# Patient Record
Sex: Female | Born: 1975 | Race: Black or African American | Hispanic: No | Smoking: Never smoker
Health system: Southern US, Community
[De-identification: ages and names within clinical notes are randomized; demographics above are authoritative.]

## PROBLEM LIST (undated history)

## (undated) DIAGNOSIS — E119 Type 2 diabetes mellitus without complications: Secondary | ICD-10-CM

## (undated) DIAGNOSIS — T7840XA Allergy, unspecified, initial encounter: Secondary | ICD-10-CM

## (undated) DIAGNOSIS — N390 Urinary tract infection, site not specified: Secondary | ICD-10-CM

## (undated) DIAGNOSIS — IMO0002 Reserved for concepts with insufficient information to code with codable children: Secondary | ICD-10-CM

## (undated) DIAGNOSIS — D649 Anemia, unspecified: Secondary | ICD-10-CM

## (undated) DIAGNOSIS — R7303 Prediabetes: Secondary | ICD-10-CM

## (undated) HISTORY — PX: TUBAL LIGATION: SHX77

## (undated) HISTORY — DX: Prediabetes: R73.03

## (undated) HISTORY — DX: Reserved for concepts with insufficient information to code with codable children: IMO0002

## (undated) HISTORY — DX: Type 2 diabetes mellitus without complications: E11.9

## (undated) HISTORY — DX: Anemia, unspecified: D64.9

## (undated) HISTORY — DX: Allergy, unspecified, initial encounter: T78.40XA

---

## 2008-02-29 ENCOUNTER — Ambulatory Visit (HOSPITAL_COMMUNITY): Admission: RE | Admit: 2008-02-29 | Discharge: 2008-02-29 | Payer: Self-pay | Admitting: Occupational Medicine

## 2008-12-10 ENCOUNTER — Emergency Department (HOSPITAL_COMMUNITY): Admission: EM | Admit: 2008-12-10 | Discharge: 2008-12-10 | Payer: Self-pay | Admitting: Family Medicine

## 2009-06-02 ENCOUNTER — Emergency Department (HOSPITAL_COMMUNITY): Admission: EM | Admit: 2009-06-02 | Discharge: 2009-06-02 | Payer: Self-pay | Admitting: Family Medicine

## 2010-10-22 LAB — URINALYSIS, MICROSCOPIC ONLY
Glucose, UA: NEGATIVE mg/dL
Hgb urine dipstick: NEGATIVE
Protein, ur: NEGATIVE mg/dL
Specific Gravity, Urine: 1.034 — ABNORMAL HIGH (ref 1.005–1.030)
pH: 6 (ref 5.0–8.0)

## 2010-10-22 LAB — POCT URINALYSIS DIP (DEVICE)
Glucose, UA: NEGATIVE mg/dL
Ketones, ur: NEGATIVE mg/dL
Specific Gravity, Urine: >=1.03 (ref 1.005–1.030)
Urobilinogen, UA: 0.2 mg/dL (ref 0.0–1.0)

## 2010-10-22 LAB — URINE CULTURE

## 2011-10-10 ENCOUNTER — Emergency Department (HOSPITAL_COMMUNITY)
Admission: EM | Admit: 2011-10-10 | Discharge: 2011-10-10 | Disposition: A | Discharge status: Home / Self Care | Payer: 59 | Source: Home / Self Care | Attending: Emergency Medicine | Admitting: Emergency Medicine

## 2011-10-10 ENCOUNTER — Encounter (HOSPITAL_COMMUNITY): Payer: Self-pay

## 2011-10-10 DIAGNOSIS — T148XXA Other injury of unspecified body region, initial encounter: Secondary | ICD-10-CM

## 2011-10-10 HISTORY — DX: Urinary tract infection, site not specified: N39.0

## 2011-10-10 LAB — POCT URINALYSIS DIP (DEVICE)
Bilirubin Urine: NEGATIVE
Glucose, UA: NEGATIVE mg/dL
Hgb urine dipstick: NEGATIVE
pH: 7 (ref 5.0–8.0)

## 2011-10-10 MED ORDER — HYDROCODONE-ACETAMINOPHEN 5-325 MG PO TABS: 2.0000 | ORAL_TABLET | ORAL | Status: AC | PRN

## 2011-10-10 MED ORDER — METAXALONE 800 MG PO TABS: 800.0000 mg | ORAL_TABLET | Freq: Three times a day (TID) | ORAL | Status: AC

## 2011-10-10 MED ORDER — MELOXICAM 15 MG PO TABS: 15.0000 mg | ORAL_TABLET | Freq: Every day | ORAL | Status: AC

## 2011-10-10 NOTE — ED Notes (Signed)
Pt has rt sided mid-back pain since Tuesday, no known injury.

## 2011-10-10 NOTE — Discharge Instructions (Signed)
Take the medication as written. Take 1 gram of tylenol up to 4 times a day as needed for pain and fever. This with the meloxicam  is an effective combination for pain. Take the hydrocodone/norco  Do not take the tylenol and hydrocodone/norco  as they both have tylenol in them and too much can hurt your liver. Return if you get worse, have a  fever >100.4, or for any concerns.   Go to www.goodrx.com to look up your medications. This will give you a list of where you can find your prescriptions at the most affordable prices.

## 2011-10-10 NOTE — ED Provider Notes (Signed)
History     CSN: [id number]  Arrival date & time 10/10/11  0904   First MD Initiated Contact with Patient 10/10/11 [phone]      Chief Complaint  Patient presents with  . Back Pain    (Consider location/radiation/quality/duration/timing/severity/associated sxs/prior treatment) HPI Comments: Patient reports achy, nonradiating left mid thoracic back pain, that is worse in the morning and with sitting for prolonged periods of time. States she feels "stiff". Pain is better with stretching. She is also tried Motrin 100 mg without relief. Patient states that she is starting a new exercise regimen of karate 4-5 times a week, and that her symptoms started shortly after beginning this new exercise regimen. No coughing wheezing, chest pain, shortness of breath, urinary complaints.  ROS as noted in HPI. All other ROS negative.   Patient is a 36 y.o. female presenting with back pain. The history is provided by the patient. No language interpreter was used.  Back Pain  This is a new problem. The current episode started more than 2 days ago. The problem occurs constantly. The problem has not changed since onset.Associated with: Increase in physical activity. The pain is present in the thoracic spine. The quality of the pain is described as aching. The pain does not radiate. The pain is the same all the time. Stiffness is present in the morning. Pertinent negatives include no chest pain, no fever, no numbness, no abdominal pain, no dysuria, no pelvic pain, no leg pain and no weakness. She has tried NSAIDs for the symptoms. The treatment provided mild relief.    Past Medical History  Diagnosis Date  . Asthma   . UTI (lower urinary tract infection)     Past Surgical History  Procedure Date  . Cesarean section   . Tubal ligation     History reviewed. No pertinent family history.  History  Substance Use Topics  . Smoking status: Never Smoker   . Smokeless tobacco: Not on file  . Alcohol Use: No      OB History    Grav Para Term Preterm Abortions TAB SAB Ect Mult Living                  Review of Systems  Constitutional: Negative for fever.  Cardiovascular: Negative for chest pain.  Gastrointestinal: Negative for abdominal pain.  Genitourinary: Negative for dysuria and pelvic pain.  Musculoskeletal: Positive for back pain.  Neurological: Negative for weakness and numbness.    Allergies  Aspirin; Lactose intolerance (gi); Ultram; and Vioxx  Home Medications   Current Outpatient Rx  Name Route Sig Dispense Refill  . ALBUTEROL SULFATE HFA 108 (90 BASE) MCG/ACT IN AERS Inhalation Inhale 2 puffs into the lungs every 6 (six) hours as needed.    Marland Kitchen HYDROCODONE-ACETAMINOPHEN 5-325 MG PO TABS Oral Take 2 tablets by mouth every 4 (four) hours as needed for pain. 20 tablet 0  . MELOXICAM 15 MG PO TABS Oral Take 1 tablet (15 mg total) by mouth daily. 14 tablet 0  . METAXALONE 800 MG PO TABS Oral Take 1 tablet (800 mg total) by mouth 3 (three) times daily. 21 tablet 0    BP 112/73  Pulse 81  Temp(Src) 98.4 F (36.9 C) (Oral)  Resp 19  SpO2 99%  LMP 09/16/2011  Physical Exam  Nursing note and vitals reviewed. Constitutional: She is oriented to person, place, and time. She appears well-developed and well-nourished. No distress.  HENT:  Head: Normocephalic and atraumatic.  Eyes: Conjunctivae and EOM  are normal.  Neck: Normal range of motion.  Cardiovascular: Normal rate, regular rhythm and normal heart sounds.   Pulmonary/Chest: Effort normal and breath sounds normal.  Abdominal: Bowel sounds are normal. She exhibits no distension. There is no tenderness. There is no rebound and no guarding.       No CVA tenderness  Musculoskeletal: Normal range of motion.       Thoracic back: She exhibits tenderness and spasm. She exhibits no bony tenderness and no swelling.       Back:  Neurological: She is alert and oriented to person, place, and time.  Skin: Skin is warm and dry.   Psychiatric: She has a normal mood and affect. Her behavior is normal. Judgment and thought content normal.    ED Course  Procedures (including critical care time)  Labs Reviewed  POCT URINALYSIS DIP (DEVICE) - Abnormal; Notable for the following:    Ketones, ur TRACE (*)    Leukocytes, UA TRACE (*) Biochemical Testing Only. Please order routine urinalysis from main lab if confirmatory testing is needed.   All other components within normal limits  POCT PREGNANCY, URINE   No results found.   1. Muscle strain       MDM  Previous records reviewed. History of Escherichia coli UTI .[employer] narcotic database queried. No narcotic prescriptions in the past year. H&P most consistent with overuse/strain. Udip noted, and the discussed with patient. Patient has no urinary symptoms. Will not treat asymptomatic bacteriuria with patient upright. Will send home with NSAIDs, muscle relaxants, short prescription of Norco. Advised patient to try massage and decrease her physical activity level until she starts to feel better.  Luiz Blare, MD 10/10/11 [phone]

## 2014-04-04 ENCOUNTER — Encounter: Payer: Self-pay | Admitting: Dietician

## 2014-04-04 ENCOUNTER — Encounter: Payer: 59 | Attending: Family Medicine | Admitting: Dietician

## 2014-04-04 VITALS — Ht 64.0 in | Wt 140.0 lb

## 2014-04-04 DIAGNOSIS — Z713 Dietary counseling and surveillance: Secondary | ICD-10-CM | POA: Insufficient documentation

## 2014-04-04 DIAGNOSIS — R7309 Other abnormal glucose: Secondary | ICD-10-CM | POA: Diagnosis present

## 2014-04-04 DIAGNOSIS — Z833 Family history of diabetes mellitus: Secondary | ICD-10-CM | POA: Diagnosis not present

## 2014-04-04 DIAGNOSIS — R7303 Prediabetes: Secondary | ICD-10-CM

## 2014-04-04 NOTE — Progress Notes (Signed)
  Medical Nutrition Therapy:  Appt start time: 0900 end time:  1000.   Assessment:  Primary concerns today: Trysta is here today referred for prediabetes with an HgbA1c of 5.8%. She has a family history of type 2 diabetes on her mother's side. Hyperinsulinemia per patient. 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 Has 7 kids, aged 38-19 years. Also going to school full time for a bachelors in nursing.   Preferred Learning Style:   No preference indicated   Learning Readiness:   Ready   MEDICATIONS: inhaler and motrin prn   DIETARY INTAKE:  Avoided foods include dairy and pork.    24-hr recall:  B ( AM): sometimes skips  Snk ( AM): none  L ( PM): [employer] chicken nuggets or burger OR cafeteria fish or pot roast with vegetables Snk ( PM): none D ( PM): hot dogs, steak, baked chicken with french fries, broccoli, green beans, Brussels sprouts Snk ( PM): cakes or candy  Beverages: water and grape Powerade  Usual physical activity: none currently  Estimated energy needs: 1600-1800 calories 180-200 g carbohydrates  Progress Towards Goal(s):  In progress.   Nutritional Diagnosis:  [place]-2.2 Altered nutrition-related laboratory As related to strong family history of type 2 diabetes, excessive carbohydrate intake, and physical inactivity.  As evidenced by HgbA1c 5.8%.    Intervention:  Nutrition counseling provided. Goals: -Start eating breakfast  -Try Premier protein shakes  -Protein bars -Start cooking more  -Choose frozen vegetables when they are not in season -Have something to eat every 3-5 hours that you're awake  -Keep snacks on you at all times -Try Powerade Zero -Start walking and workout routine again  -Go back to martial arts  -3-5x a week  -Fill up on non-starchy vegetables  -Veggies are good raw or cooked, fresh or frozen -Watch portions of carbs  -Choose high fiber and low sugar carbs  -Have 2-3 carb servings per meal and 1 at snacks -Snack  on fruit and cashews at night instead of cake and cookies  -Watch portions!  -Practice reading serving size on food labels  -Try having a mini bag of popcorn with a fun size pack of M&Ms  -Try having individually wrapped candies if you're craving something sweet and stick to 3 -Pre portion snacks in baggies or bowls or tupperwares  Teaching Method Utilized:  Visual Auditory Hands on  Handouts given during visit include:  Carbohydrate serving card  MyPlate  15g CHO + protein snacks  Samples provided and patient instructed on proper use: Premier protein shake (strawberry - qty 2) Lot#: [id number] Exp: 11/2014  Barriers to learning/adherence to lifestyle change: schedule  Demonstrated degree of understanding via:  Teach Back   Monitoring/Evaluation:  Dietary intake, exercise, and body weight in 4 week(s).

## 2014-04-04 NOTE — Patient Instructions (Addendum)
-  Start eating breakfast  -Try Premier protein shakes  -Protein bars  -Start cooking more  -Choose frozen vegetables when they are not in season  -Have something to eat every 3-5 hours that you're awake  -Keep snacks on you at all times  -Try Powerade Zero  -Start walking and workout routine again  -Go back to martial arts  -3-5x a week   -Fill up on non-starchy vegetables  -Veggies are good raw or cooked, fresh or frozen  -Watch portions of carbs  -Choose high fiber and low sugar carbs  -Have 2-3 carb servings per meal and 1 at snacks  -Snack on fruit and cashews at night instead of cake and cookies  -Watch portions!  -Practice reading serving size on food labels  -Try having a mini bag of popcorn with a fun size pack of M&Ms  -Try having individually wrapped candies if you're craving something sweet and stick to 3  -Pre portion snacks in baggies or bowls or tupperwares 

## 2014-05-16 ENCOUNTER — Encounter: Payer: 59 | Attending: Family Medicine | Admitting: Dietician

## 2014-05-16 VITALS — Wt 143.7 lb

## 2014-05-16 DIAGNOSIS — R7303 Prediabetes: Secondary | ICD-10-CM

## 2014-05-16 DIAGNOSIS — R7309 Other abnormal glucose: Secondary | ICD-10-CM | POA: Diagnosis present

## 2014-05-16 DIAGNOSIS — Z713 Dietary counseling and surveillance: Secondary | ICD-10-CM | POA: Insufficient documentation

## 2014-05-16 NOTE — Patient Instructions (Signed)
-  Start eating breakfast  -Try Premier protein shakes  -Protein bars  -Start cooking more  -Choose frozen vegetables when they are not in season  -Have something to eat every 3-5 hours that you're awake  -Keep snacks on you at all times  -Try Powerade Zero  -Start walking and workout routine again  -Go back to martial arts  -3-5x a week   -Fill up on non-starchy vegetables  -Veggies are good raw or cooked, fresh or frozen  -Watch portions of carbs  -Choose high fiber and low sugar carbs  -Have 2-3 carb servings per meal and 1 at snacks  -Snack on fruit and cashews at night instead of cake and cookies  -Watch portions!  -Practice reading serving size on food labels  -Try having a mini bag of popcorn with a fun size pack of M&Ms  -Try having individually wrapped candies if you're craving something sweet and stick to 3  -Pre portion snacks in baggies or bowls or tupperwares

## 2014-05-16 NOTE — Progress Notes (Signed)
  Medical Nutrition Therapy:  Appt start time: 1200 end time:  1215   Follow up:  Deborah Chavez returns today having gained 3 pounds. However, she has made many positive lifestyle changes. She states that she has been focusing on serving sizes of foods, reading food labels, and pairing carbohydrates with protein foods. No longer eating cakes at night. Eating breakfast every day now with a carb and protein. She is pre-portioning her snacks into baggies. Started drinking Powerade zero instead of regular powerade. Had HgbA1c lab drawn today, no results yet. 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 Deborah Chavez is currently waiting for strips and lancets to be approved. Recent abnormal pap smear, being tested for cancer.  Preferred Learning Style:   No preference indicated   Learning Readiness:   Ready   MEDICATIONS: inhaler and motrin prn   DIETARY INTAKE:  Avoided foods include dairy and pork.    24-hr recall:  B ( AM): pancake or muffin with sausage  Snk ( AM): none  L ( PM): [place] sub or grilled chicken or chicken and broccoli Snk ( PM): pretzels or oranges D ( PM): hot dogs, steak, baked chicken with french fries, broccoli, green beans, Brussels sprouts Snk ( PM):   Beverages: Powerade zero and water  Usual physical activity: karate 2-4x a week  Estimated energy needs: 1600-1800 calories 180-200 g carbohydrates  Progress Towards Goal(s):  In progress.   Nutritional Diagnosis:  [place]-2.2 Altered nutrition-related laboratory As related to strong family history of type 2 diabetes, excessive carbohydrate intake, and physical inactivity.  As evidenced by HgbA1c 5.8%.    Intervention:  Nutrition counseling provided.   Teaching Method Utilized:  Visual Auditory Hands on  Barriers to learning/adherence to lifestyle change: schedule  Demonstrated degree of understanding via:  Teach Back   Monitoring/Evaluation:  Dietary intake, exercise, and body weight in 4 week(s).

## 2014-09-22 ENCOUNTER — Emergency Department (HOSPITAL_COMMUNITY)
Admission: EM | Admit: 2014-09-22 | Discharge: 2014-09-22 | Disposition: A | Discharge status: Home / Self Care | Payer: 59 | Attending: Emergency Medicine | Admitting: Emergency Medicine

## 2014-09-22 ENCOUNTER — Encounter (HOSPITAL_COMMUNITY): Payer: Self-pay | Admitting: Emergency Medicine

## 2014-09-22 DIAGNOSIS — Z3202 Encounter for pregnancy test, result negative: Secondary | ICD-10-CM | POA: Diagnosis not present

## 2014-09-22 DIAGNOSIS — J45909 Unspecified asthma, uncomplicated: Secondary | ICD-10-CM | POA: Diagnosis not present

## 2014-09-22 DIAGNOSIS — E162 Hypoglycemia, unspecified: Secondary | ICD-10-CM | POA: Diagnosis present

## 2014-09-22 DIAGNOSIS — Z87440 Personal history of urinary (tract) infections: Secondary | ICD-10-CM | POA: Diagnosis not present

## 2014-09-22 DIAGNOSIS — Z79899 Other long term (current) drug therapy: Secondary | ICD-10-CM | POA: Insufficient documentation

## 2014-09-22 DIAGNOSIS — R42 Dizziness and giddiness: Secondary | ICD-10-CM | POA: Diagnosis not present

## 2014-09-22 LAB — URINALYSIS, ROUTINE W REFLEX MICROSCOPIC
BILIRUBIN URINE: NEGATIVE
GLUCOSE, UA: NEGATIVE mg/dL
HGB URINE DIPSTICK: NEGATIVE
Ketones, ur: NEGATIVE mg/dL
LEUKOCYTES UA: NEGATIVE
Nitrite: NEGATIVE
PROTEIN: NEGATIVE mg/dL
SPECIFIC GRAVITY, URINE: 1.029 (ref 1.005–1.030)
Urobilinogen, UA: 1 mg/dL (ref 0.0–1.0)
pH: 7 (ref 5.0–8.0)

## 2014-09-22 LAB — I-STAT CHEM 8, ED
BUN: 17 mg/dL (ref 6–23)
CALCIUM ION: 1.08 mmol/L — AB (ref 1.12–1.23)
CHLORIDE: 100 mmol/L (ref 96–112)
CREATININE: 0.8 mg/dL (ref 0.50–1.10)
Glucose, Bld: 110 mg/dL — ABNORMAL HIGH (ref 70–99)
HEMATOCRIT: 39 % (ref 36.0–46.0)
Hemoglobin: 13.3 g/dL (ref 12.0–15.0)
Potassium: 3.8 mmol/L (ref 3.5–5.1)
Sodium: 137 mmol/L (ref 135–145)
TCO2: 23 mmol/L (ref 0–100)

## 2014-09-22 LAB — CBG MONITORING, ED: Glucose-Capillary: 111 mg/dL — ABNORMAL HIGH (ref 70–99)

## 2014-09-22 LAB — PREGNANCY, URINE: Preg Test, Ur: NEGATIVE

## 2014-09-22 NOTE — ED Provider Notes (Signed)
CSN: [id number]     Arrival date & time 09/22/14  1714 History   First MD Initiated Contact with Patient 09/22/14 1720     Chief Complaint  Patient presents with  . Hypoglycemia     (Consider location/radiation/quality/duration/timing/severity/associated sxs/prior Treatment) HPI Deborah Chavez is a 39 y.o. female with a history of asthma and prediabetes comes in for evaluation of "low blood sugar". Patient states she does not take diabetes medications yet, but has been monitoring her sugars very closely recently and any time she falls below 90 she begins to feel "flushed, dizzy and sweaty". She reports having felt the symptoms throughout the day today and noted her blood sugars to be 65 at lunch time and 85 after lunch. She is concerned because she checked it once more after lunch and the reading was 81. She does, however report feeling better after lunch and then began to feel more dizzy this afternoon. She characterizes the dizziness as "the room is spinning around me". She denies any headaches, vision changes, chest pain, short of breath, nausea or vomiting, abdominal pain, weakness, syncope, rash. She reports her menstrual cycle is unchanged, denies any dark stools or bloody diarrhea. CBG on arrival 111  Past Medical History  Diagnosis Date  . Asthma   . UTI (lower urinary tract infection)   . Prediabetes    Past Surgical History  Procedure Laterality Date  . Cesarean section    . Tubal ligation     Family History  Problem Relation Age of Onset  . Diabetes Mother   . Diabetes Maternal Aunt   . Diabetes Maternal Uncle   . Diabetes Maternal Grandmother   . Cancer Other   . Stroke Other   . Hypertension Other    History  Substance Use Topics  . Smoking status: Never Smoker   . Smokeless tobacco: Not on file  . Alcohol Use: No   OB History    No data available     Review of Systems A 10 point review of systems was completed and was negative except for pertinent positives  and negatives as mentioned in the history of present illness     Allergies  Aspirin; Lactose intolerance (gi); Ultram; and Vioxx  Home Medications   Prior to Admission medications   Medication Sig Start Date End Date Taking? Authorizing Provider  albuterol (PROVENTIL HFA;VENTOLIN HFA) 108 (90 BASE) MCG/ACT inhaler Inhale 2 puffs into the lungs every 6 (six) hours as needed.    Historical Provider, MD   BP 103/61 mmHg  Pulse 85  Temp(Src) 98.5 F (36.9 C) (Oral)  Resp 11  Ht 5\' 4"  (1.626 m)  SpO2 99%  LMP 09/07/2014 Physical Exam  Constitutional: She is oriented to person, place, and time. She appears well-developed and well-nourished. No distress.  HENT:  Head: Normocephalic and atraumatic.  Mouth/Throat: Oropharynx is clear and moist. No oropharyngeal exudate.  Eyes: Conjunctivae are normal. Pupils are equal, round, and reactive to light. Right eye exhibits no discharge. Left eye exhibits no discharge. No scleral icterus.  Neck: Neck supple.  Cardiovascular: Normal rate, regular rhythm, normal heart sounds and intact distal pulses.   Pulmonary/Chest: Effort normal and breath sounds normal. No respiratory distress. She has no wheezes. She has no rales.  Abdominal: Soft. There is no tenderness.  Musculoskeletal: She exhibits no tenderness.  Neurological: She is alert and oriented to person, place, and time.  Cranial Nerves II-XII grossly intact. Motor and sensation 5/5 in all 4 extremities. Gait is  baseline  Skin: Skin is warm and dry. No rash noted. She is not diaphoretic.  Psychiatric: She has a normal mood and affect.  Nursing note and vitals reviewed.   ED Course  Procedures (including critical care time) Labs Review Labs Reviewed  CBG MONITORING, ED - Abnormal; Notable for the following:    Glucose-Capillary 111 (*)    All other components within normal limits  I-STAT CHEM 8, ED - Abnormal; Notable for the following:    Glucose, Bld 110 (*)    Calcium, Ion 1.08  (*)    All other components within normal limits  PREGNANCY, URINE  URINALYSIS, ROUTINE W REFLEX MICROSCOPIC    Imaging Review No results found.   EKG Interpretation   Date/Time:  Saturday September 22 2014 18:00:24 EST Ventricular Rate:  84 PR Interval:  157 QRS Duration: 78 QT Interval:  363 QTC Calculation: 429 R Axis:   68 Text Interpretation:  Sinus rhythm Isolated TWI lead III Confirmed by  DOCHERTY  MD, MEGAN ([id number]) on 09/22/2014 6:34:13 PM     Meds given in ED:  Medications - No data to display  New Prescriptions   No medications on file   Filed Vitals:   09/22/14 1900 09/22/14 1915 09/22/14 1945 09/22/14 1946  BP: 113/68 105/68 103/61   Pulse: 81 78 85   Temp:    98.5 F (36.9 C)  TempSrc:    Oral  Resp: Height:      SpO2: 100% 100% 99%     MDM  Vitals stable - WNL -afebrile Pt resting comfortably in ED. Denies dizziness or discomfort now. PE--normal neuro exam. Otherwise Unremarkable Labwork noncontributory. CBG 110. EKG not concerning.  DDX--no evidence of hypoglycemia. No emergent cause for dizziness or diaphoresis at this time. The symptoms have resolved in the ED spontaneously. Discussed follow-up with her PCP for further evaluation and management of her symptoms   I discussed all relevant lab findings and imaging results with pt and they verbalized understanding. Discussed f/u with PCP within 48 hrs and return precautions, pt very amenable to plan. Patient appears well, is in good condition and is appropriate for discharge.  Prior to patient discharge, I discussed and reviewed this case with Dr. Micheline Maze   Final diagnoses:  Dizziness        Sharlene Motts, PA-C 09/22/14 1953  Toy Cookey, MD 09/23/14 [phone]

## 2014-09-22 NOTE — Discharge Instructions (Signed)

## 2014-09-22 NOTE — ED Notes (Signed)
CBG 111 

## 2014-09-22 NOTE — ED Notes (Signed)
Prediabetic, no diabetic medications prescribed, checking CBG due to dizziness.  CBG 65 at 1200, took 2 glucose tablets, grahams with peanut butter, and [place] sausage. Rechecked at 1320 and CBG 85; ate again; at 1517 CBG 81. Continued to feel dizzy and diaphoretic, checked again at 1639, CBG 80.

## 2014-09-22 NOTE — ED Notes (Signed)
CBG was 111, Nurse was informed.

## 2015-07-24 DIAGNOSIS — Z0100 Encounter for examination of eyes and vision without abnormal findings: Secondary | ICD-10-CM | POA: Diagnosis not present

## 2015-09-03 DIAGNOSIS — Z23 Encounter for immunization: Secondary | ICD-10-CM | POA: Diagnosis not present

## 2015-09-03 DIAGNOSIS — Z7189 Other specified counseling: Secondary | ICD-10-CM | POA: Diagnosis not present

## 2016-01-06 ENCOUNTER — Ambulatory Visit (INDEPENDENT_AMBULATORY_CARE_PROVIDER_SITE_OTHER): Payer: 59 | Admitting: Family Medicine

## 2016-01-06 VITALS — BP 118/74 | HR 66 | Temp 98.1°F | Resp 18 | Ht 64.0 in | Wt 150.8 lb

## 2016-01-06 DIAGNOSIS — M25562 Pain in left knee: Secondary | ICD-10-CM | POA: Diagnosis not present

## 2016-01-06 DIAGNOSIS — S83207A Unspecified tear of unspecified meniscus, current injury, left knee, initial encounter: Secondary | ICD-10-CM

## 2016-01-06 MED ORDER — MELOXICAM 15 MG PO TABS: 15.0000 mg | ORAL_TABLET | Freq: Every day | ORAL | Status: DC

## 2016-01-06 MED FILL — MELOXICAM 15 MG TABLET: 15 | 30 days supply | Qty: 30 | Fill #0

## 2016-01-06 NOTE — Patient Instructions (Addendum)
   IF you received an x-ray today, you will receive an invoice from [facility]. Please contact [facility] at [phone] with questions or concerns regarding your invoice.   IF you received labwork today, you will receive an invoice from [facility]. Please contact [facility] at [phone] with questions or concerns regarding your invoice.   Our billing staff will not be able to assist you with questions regarding bills from these companies.  You will be contacted with the lab results as soon as they are available. The fastest way to get your results is to activate your My Chart account. Instructions are located on the last page of this paperwork. If you have not heard from us regarding the results in 2 weeks, please contact this office.     Meniscus Tear With Phase I Rehab The meniscus is a C-shaped cartilage structure, located in the knee joint between the thigh bone (femur) and the shinbone (tibia). Two menisci are located in each knee joint: the inner and outer meniscus. The meniscus acts as an adapter between the thigh bone and shinbone, allowing them to fit properly together. It also functions as a shock absorber, to reduce the stress placed on the knee joint and to help supply nutrients to the knee joint cartilage. As people age, the meniscus begins to harden and become more vulnerable to injury. Meniscus tears are a common injury, especially in older athletes. Inner meniscus tears are more common than outer meniscus tears.  SYMPTOMS   Pain in the knee, especially with standing or squatting with the affected leg.  Tenderness along the joint line.  Swelling in the knee joint (effusion), usually starting 1 to 2 days after injury.  Locking or catching of the knee joint, causing inability to straighten the knee completely.  Giving way or buckling of the knee. CAUSES  A meniscus tear occurs when a force is placed on the meniscus that is  greater than it can handle. Common causes of injury include:  Direct hit (trauma) to the knee.  Twisting, pivoting, or cutting (rapidly changing direction while running), kneeling or squatting.  Without injury, due to aging. RISK INCREASES WITH:  Contact sports (football, rugby).  Sports in which cleats are used with pivoting (soccer, lacrosse) or sports in which good shoe grip and sudden change in direction are required (racquetball, basketball, squash).  Previous knee injury.  Associated knee injury, particularly ligament injuries.  Poor strength and flexibility. PREVENTION  Warm up and stretch properly before activity.  Maintain physical fitness:  Strength, flexibility, and endurance.  Cardiovascular fitness.  Protect the knee with a brace or elastic bandage.  Wear properly fitted protective equipment (proper cleats for the surface). PROGNOSIS  Sometimes, meniscus tears heal on their own. However, definitive treatment requires surgery, followed by at least 6 weeks of recovery.  RELATED COMPLICATIONS   Recurring symptoms that result in a chronic problem.  Repeated knee injury, especially if sports are resumed too soon after injury or surgery.  Progression of the tear (the tear gets larger), if untreated.  Arthritis of the knee in later years (with or without surgery).  Complications of surgery, including infection, bleeding, injury to nerves (numbness, weakness, paralysis) continued pain, giving way, locking, nonhealing of meniscus (if repaired), need for further surgery, and knee stiffness (loss of motion). TREATMENT  Treatment first involves the use of ice and medicine, to reduce pain and inflammation. You may find using crutches to walk more comfortable. However, it is okay to   bear weight on the injured knee, if the pain will allow it. Surgery is often advised as a definitive treatment. Surgery is performed through an incision near the joint (arthroscopically). The  torn piece of the meniscus is removed, and if possible the joint cartilage is repaired. After surgery, the joint must be restrained. After restraint, it is important to perform strengthening and stretching exercises to help regain strength and a full range of motion. These exercises may be completed at home or with a therapist.  MEDICATION  If pain medicine is needed, nonsteroidal anti-inflammatory medicines (aspirin and ibuprofen), or other minor pain relievers (acetaminophen), are often advised.  Do not take pain medicine for 7 days before surgery.  Prescription pain relievers may be given, if your caregiver thinks they are needed. Use only as directed and only as much as you need. HEAT AND COLD  Cold treatment (icing) should be applied for 10 to 15 minutes every 2 to 3 hours for inflammation and pain, and immediately after activity that aggravates your symptoms. Use ice packs or an ice massage.  Heat treatment may be used before performing stretching and strengthening activities prescribed by your caregiver, physical therapist, or athletic trainer. Use a heat pack or a warm water soak. SEEK MEDICAL CARE IF:   Symptoms get worse or do not improve in 2 weeks, despite treatment.  New, unexplained symptoms develop. (Drugs used in treatment may produce side effects.) EXERCISES RANGE OF MOTION (ROM) AND STRETCHING EXERCISES - Meniscus Tear, Non-operative, Phase I These are some of the initial exercises with which you may start your rehabilitation program, until you see your caregiver again or until your symptoms are resolved. Remember:   These initial exercises are intended to be gentle. They will help you restore motion without increasing any swelling.  Completing these exercises allows less painful movement and prepares you for the more aggressive strengthening exercises in Phase II.  An effective stretch should be held for at least 30 seconds.  A stretch should never be painful. You  should only feel a gentle lengthening or release in the stretched tissue. RANGE OF MOTION - Knee Flexion, Active  Lie on your back with both knees straight. (If this causes back discomfort, bend your healthy knee, placing your foot flat on the floor.)  Slowly slide your heel back toward your buttocks until you feel a gentle stretch in the front of your knee or thigh.  Hold for __________ seconds. Slowly slide your heel back to the starting position. Repeat __________ times. Complete this exercise __________ times per day.  RANGE OF MOTION - Knee Flexion and Extension, Active-Assisted  Sit on the edge of a table or chair with your thighs firmly supported. It may be helpful to place a folded towel under the end of your right / left thigh.  Flexion (bending): Place the ankle of your healthy leg on top of the other ankle. Use your healthy leg to gently bend your right / left knee until you feel a mild tension across the top of your knee.  Hold for __________ seconds.  Extension (straightening): Switch your ankles so your right / left leg is on top. Use your healthy leg to straighten your right / left knee until you feel a mild tension on the backside of your knee.  Hold for __________ seconds. Repeat __________ times. Complete __________ times per day. STRETCH - Knee Flexion, Supine  Lie on the floor with your right / left heel and foot lightly touching the wall. (  Place both feet on the wall if you do not use a door frame.)  Without using any effort, allow gravity to slide your foot down the wall slowly until you feel a gentle stretch in the front of your right / left knee.  Hold this stretch for __________ seconds. Then return the leg to the starting position, using your healthy leg for help, if needed. Repeat __________ times. Complete this stretch __________ times per day.  STRETCH - Knee Extension Sitting  Sit with your right / left leg/heel propped on another chair, coffee table, or  foot stool.  Allow your leg muscles to relax, letting gravity straighten out your knee.*  You should feel a stretch behind your right / left knee. Hold this position for __________ seconds. Repeat __________ times. Complete this stretch __________ times per day.  *Your physician, physical therapist or athletic trainer may instruct you place a __________ weight on your thigh, just above your kneecap, to deepen the stretch.  STRENGTHENING EXERCISES - Meniscus Tear, Non-operative, Phase I These exercises may help you when beginning to rehabilitate your injury. They may resolve your symptoms with or without further involvement from your physician, physical therapist or athletic trainer. While completing these exercises, remember:   Muscles can gain both the endurance and the strength needed for everyday activities through controlled exercises.  Complete these exercises as instructed by your physician, physical therapist or athletic trainer. Progress the resistance and repetitions only as guided. STRENGTH - Quadriceps, Isometrics  Lie on your back with your right / left leg extended and your opposite knee bent.  Gradually tense the muscles in the front of your right / left thigh. You should see either your knee cap slide up toward your hip or increased dimpling just above the knee. This motion will push the back of the knee down toward the floor, mat, or bed on which you are lying.  Hold the muscle as tight as you can, without increasing your pain, for __________ seconds.  Relax the muscles slowly and completely between each repetition. Repeat __________ times. Complete this exercise __________ times per day.  STRENGTH - Quadriceps, Short Arcs   Lie on your back. Place a __________ inch towel roll under your right / left knee, so that the knee bends slightly.  Raise only your lower leg by tightening the muscles in the front of your thigh. Do not allow your thigh to rise.  Hold this position  for __________ seconds. Repeat __________ times. Complete this exercise __________ times per day.  OPTIONAL ANKLE WEIGHTS: Begin with ____________________, but DO NOT exceed ____________________. Increase in 1 pound/0.5 kilogram increments. STRENGTH - Quadriceps, Straight Leg Raises  Quality counts! Watch for signs that the quadriceps muscle is working, to be sure you are strengthening the correct muscles and not "cheating" by substituting with healthier muscles.  Lay on your back with your right / left leg extended and your opposite knee bent.  Tense the muscles in the front of your right / left thigh. You should see either your knee cap slide up or increased dimpling just above the knee. Your thigh may even shake a bit.  Tighten these muscles even more and raise your leg 4 to 6 inches off the floor. Hold for __________ seconds.  Keeping these muscles tense, lower your leg.  Relax the muscles slowly and completely in between each repetition. Repeat __________ times. Complete this exercise __________ times per day.  STRENGTH - Hamstring, Curls   Lay on your stomach   with your legs extended. (If you lay on a bed, your feet may hang over the edge.)  Tighten the muscles in the back of your thigh to bend your right / left knee up to 90 degrees. Keep your hips flat on the bed.  Hold this position for __________ seconds.  Slowly lower your leg back to the starting position. Repeat __________ times. Complete this exercise __________ times per day.  STRENGTH - Quadriceps, Squats  Stand in a door frame so that your feet and knees are in line with the frame.  Use your hands for balance, not support, on the frame.  Slowly lower your weight, bending at the hips and knees. Keep your lower legs upright so that they are parallel with the door frame. Squat only within the range that does not increase your knee pain. Never let your hips drop below your knees.  Slowly return upright, pushing with your  legs, not pulling with your hands. Repeat __________ times. Complete this exercise __________ times per day.  STRENGTH - Quad/VMO, Isometric   Sit in a chair with your right / left knee slightly bent. With your fingertips, feel the VMO muscle just above the inside of your knee. The VMO is important in controlling the position of your kneecap.  Keeping your fingertips on this muscle. Without actually moving your leg, attempt to drive your knee down as if straightening your leg. You should feel your VMO tense. If you have a difficult time, you may wish to try the same exercise on your healthy knee first.  Tense this muscle as hard as you can without increasing any knee pain.  Hold for __________ seconds. Relax the muscles slowly and completely in between each repetition. Repeat __________ times. Complete exercise __________ times per day.    This information is not intended to replace advice given to you by your health care provider. Make sure you discuss any questions you have with your health care provider.   Document Released: 07/20/1998 Document Revised: 11/20/2014 Document Reviewed: 10/18/2008 Elsevier Interactive Patient Education [employer]

## 2016-01-06 NOTE — Progress Notes (Addendum)
By signing my name below, I, Mesha Guinyard, attest that this documentation has been prepared under the direction and in the presence of Norberto Sorenson, MD.  Electronically Signed: Arvilla Market, Medical Scribe. 01/06/2016. 12:28 PM.  Subjective:    Patient ID: Deborah Chavez, female    DOB: [birth year], 40 y.o.   MRN: [id number]  HPI  Chief Complaint  Patient presents with  . Knee Pain    Has had knee problems in the past. left knee. knee wants to lock up at times.     HPI Comments: EDLA PARA is a 40 y.o. female who presents to the [facility] complaining of left knee pain. Pt has had a past knee injury, and when she tries to get up it will lock up on her and twist when it does. Pt heard and felt a loud pop 6 days ago. When pt bends her knee, it will lock up and she has to force it to straighten out; this takes an hour to a day to straighten out. Pt has pain with ambulation; at times she can hardly put pressure on her toes when getting up. Pt has knee sleeve at home that give little relief to her symptoms. Pt initially injured her knee at by banging in the handles when moving inside her desk and a hitting a med cart with her knee.  Pt had a MRI done to her knee in the past- pt never had a specific ligament tear. Pt has [place] orthopedic [place] few years ago: X-Ray a few years ago didn't see anything wrong with her leg. [facility] doesn't offer the PT her insurance covers.  Pt brakes out into a itchy rash when she takes Vioxx and Motrin puts her to sleep.  There are no active problems to display for this patient.  Past Medical History  Diagnosis Date  . Asthma   . UTI (lower urinary tract infection)   . Prediabetes   . Allergy   . Anemia   . Diabetes mellitus without complication [facility])    Past Surgical History  Procedure Laterality Date  . Cesarean section    . Tubal ligation     Allergies  Allergen Reactions  . Aspirin   . Lactose Intolerance  (Gi)   . Ultram [Tramadol Hcl]   . Vioxx [Rofecoxib]    Prior to Admission medications   Medication Sig Start Date End Date Taking? Authorizing Provider  albuterol (PROVENTIL HFA;VENTOLIN HFA) 108 (90 BASE) MCG/ACT inhaler Inhale 2 puffs into the lungs every 6 (six) hours as needed.   Yes Historical Provider, MD  ibuprofen (ADVIL,MOTRIN) 200 MG tablet Take 200 mg by mouth every 6 (six) hours as needed.   Yes Historical Provider, MD  montelukast (SINGULAIR) 10 MG tablet Take 10 mg by mouth at bedtime.   Yes Historical Provider, MD   Social History   Social History  . Marital Status: Married    Spouse Name: N/A  . Number of Children: N/A  . Years of Education: N/A   Occupational History  . Not on file.   Social History Main Topics  . Smoking status: Never Smoker   . Smokeless tobacco: Not on file  . Alcohol Use: No  . Drug Use: No  . Sexual Activity: Not on file   Other Topics Concern  . Not on file   Social History Narrative    Depression screen [facility] 2/9 01/06/2016  Decreased Interest 0  Down, Depressed,  Hopeless 0  PHQ - 2 Score 0    Review of Systems  Constitutional: Positive for activity change. Negative for fever.  HENT: Negative for rhinorrhea and sneezing.   Respiratory: Negative for cough.   Cardiovascular: Negative for leg swelling.  Musculoskeletal: Positive for arthralgias and gait problem. Negative for myalgias and joint swelling.  Neurological: Positive for weakness. Negative for numbness and headaches.  Hematological: Does not bruise/bleed easily.  Psychiatric/Behavioral: Positive for sleep disturbance.     Objective:  BP 118/74 mmHg  Pulse 66  Temp(Src) 98.1 F (36.7 C) (Oral)  Resp 18  Ht 5\' 4"  (1.626 m)  Wt 150 lb 12.8 oz (68.402 kg)  BMI 25.87 kg/m2  SpO2 100%  LMP 12/02/2015  Physical Exam  Constitutional: She appears well-developed and well-nourished. No distress.  HENT:  Head: Normocephalic and atraumatic.  Eyes: Conjunctivae are  normal.  Neck: Neck supple.  Cardiovascular: Normal rate.   Pulmonary/Chest: Effort normal.  Musculoskeletal:  Positive mild effusion No joint line tenderness No tenderness on the tibial platue Non tenderness of ACL or LCL No laxity with varus or valgus stress No crepitus Negative McMurrays No anterior and posterior drawer No bakers cyst palpable  Neurological: She is alert.  Skin: Skin is warm and dry.  Psychiatric: She has a normal mood and affect. Her behavior is normal.  Nursing note and vitals reviewed.   12:47 PM pt feels more secure with the knee brace but can't move her knee more than 10 degrees. Pt was able to bend her knee once the knee brace was put on properly.    Assessment & Plan:   1. Knee pain, acute, left   2. Acute meniscal tear of knee, left, initial encounter - no known injury but certainly history of twisting and locking sensation consistent. Placed in hinged knee brace and refer to ortho ([facility] for further eval - may need repeat MRI.  Has tolerated mobic in past other than sedation so try qhs.    Orders Placed This Encounter  Procedures  . Ambulatory referral to Orthopedic Surgery    Referral Priority:  Routine    Referral Type:  Surgical    Referral Reason:  Specialty Services Required    Requested Specialty:  Orthopedic Surgery    Number of Visits Requested:  1    Meds ordered this encounter  Medications  . montelukast (SINGULAIR) 10 MG tablet    Sig: Take 10 mg by mouth at bedtime.  Marland Kitchen. DISCONTD: ibuprofen (ADVIL,MOTRIN) 200 MG tablet    Sig: Take 200 mg by mouth every 6 (six) hours as needed.  . meloxicam (MOBIC) 15 MG tablet    Sig: Take 1 tablet (15 mg total) by mouth at bedtime.    Dispense:  30 tablet    Refill:  1    I personally performed the services described in this documentation, which was scribed in my presence. The recorded information has been reviewed and considered, and addended by me as needed.    Norberto SorensonEva Antwoine Zorn, M.D.  [facility]  [place] [address] [place], [place] [address] [phone] phone [phone] fax  01/06/2016 1:15 PM

## 2016-01-22 DIAGNOSIS — R7309 Other abnormal glucose: Secondary | ICD-10-CM | POA: Diagnosis not present

## 2016-01-22 DIAGNOSIS — S8390XA Sprain of unspecified site of unspecified knee, initial encounter: Secondary | ICD-10-CM | POA: Diagnosis not present

## 2016-01-22 DIAGNOSIS — M25562 Pain in left knee: Secondary | ICD-10-CM | POA: Diagnosis not present

## 2016-01-24 ENCOUNTER — Other Ambulatory Visit (HOSPITAL_COMMUNITY): Payer: Self-pay | Admitting: Physician Assistant

## 2016-01-24 DIAGNOSIS — M25562 Pain in left knee: Secondary | ICD-10-CM

## 2016-01-29 ENCOUNTER — Ambulatory Visit (HOSPITAL_COMMUNITY)
Admission: RE | Admit: 2016-01-29 | Discharge: 2016-01-29 | Disposition: A | Discharge status: Home / Self Care | Payer: 59 | Source: Ambulatory Visit | Attending: Physician Assistant | Admitting: Physician Assistant

## 2016-01-29 DIAGNOSIS — M25562 Pain in left knee: Secondary | ICD-10-CM | POA: Diagnosis not present

## 2016-01-29 DIAGNOSIS — M2242 Chondromalacia patellae, left knee: Secondary | ICD-10-CM | POA: Insufficient documentation

## 2016-01-29 DIAGNOSIS — M25462 Effusion, left knee: Secondary | ICD-10-CM | POA: Diagnosis not present

## 2016-01-29 DIAGNOSIS — R600 Localized edema: Secondary | ICD-10-CM | POA: Diagnosis not present

## 2016-02-24 DIAGNOSIS — M25562 Pain in left knee: Secondary | ICD-10-CM | POA: Diagnosis not present

## 2016-03-03 ENCOUNTER — Other Ambulatory Visit: Payer: Self-pay | Admitting: *Deleted

## 2016-03-03 ENCOUNTER — Encounter: Payer: Self-pay | Admitting: *Deleted

## 2016-03-03 DIAGNOSIS — Z7189 Other specified counseling: Secondary | ICD-10-CM | POA: Diagnosis not present

## 2016-03-03 DIAGNOSIS — IMO0002 Reserved for concepts with insufficient information to code with codable children: Secondary | ICD-10-CM

## 2016-03-03 DIAGNOSIS — Z23 Encounter for immunization: Secondary | ICD-10-CM | POA: Diagnosis not present

## 2016-03-05 ENCOUNTER — Encounter: Payer: Self-pay | Admitting: *Deleted

## 2016-03-05 DIAGNOSIS — IMO0002 Reserved for concepts with insufficient information to code with codable children: Secondary | ICD-10-CM | POA: Insufficient documentation

## 2016-03-05 NOTE — Patient Outreach (Signed)
[facility] Care Management   03/03/2016  Deborah Chavez 1975-08-21 [id number]  Deborah Chavez is an 40 y.o. female who [facility] office for routine Link To Wellness follow up for self management assistance with prediabetes and overweight.  Subjective: Deborah JudeKeshia says she is doing well but would like to lose weight so that her body mass index is <25. 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 She says her fasting blood sugar is at least 125 anytime she checks. She occasionally checks post meal and says her readings are consistently 153- 156. She defines her hypoglycemic threshold as 90.  She says she has controlled her asthma which she has had since a child.  Objective:   Review of Systems  Constitutional: Negative.     Physical Exam  Constitutional: She is oriented to person, place, and time. She appears well-developed and well-nourished.  Respiratory: Effort normal.  Neurological: She is alert and oriented to person, place, and time.  Skin: Skin is warm and dry.  Psychiatric: She has a normal mood and affect. Her behavior is normal. Judgment and thought content normal.    Encounter Medications:   Outpatient Encounter Prescriptions as of 03/03/2016  Medication Sig Note  . albuterol (PROVENTIL HFA;VENTOLIN HFA) 108 (90 BASE) MCG/ACT inhaler Inhale 2 puffs into the lungs every 6 (six) hours as needed.   . montelukast (SINGULAIR) 10 MG tablet Take 10 mg by mouth at bedtime.   . meloxicam (MOBIC) 15 MG tablet Take 1 tablet (15 mg total) by mouth at bedtime. (Patient not taking: Reported on 03/03/2016) 03/03/2016: She took one dose and had extreme intolerance   No facility-administered encounter medications on file as of 03/03/2016.     Functional Status:   In your present state of health, do  you have any difficulty performing the following activities: 03/03/2016  Hearing? N  Vision? N  Difficulty concentrating or making decisions? N  Walking or climbing stairs? N  Dressing or bathing? N  Doing errands, shopping? N  Some recent data might be hidden    Fall/Depression Screening:    PHQ 2/9 Scores 01/06/2016  PHQ - 2 Score 0    Assessment:   [employer] employee and Link To Wellness member with prediabetes, most current Hgb A1C= 6.1%  Plan:  [facility] CM Care Plan Problem One   Flowsheet Row Most Recent Value  Care Plan Problem One  Patient with prediabetes with most recent Hgb A1C= 6.1%, overweight  Role Documenting the Problem One  Care Management Coordinator  Care Plan for Problem One  Active  [facility] Long Term Goal (31-90 days)  Patient will not progress from prediabetes to Type II DM as evidenced by Hgb A1C <6.4% and CBGs meeting target at least 75% of the time, patient will lose weight and/or no weight gain  [facility] Long Term Goal Start Date  03/03/16  Interventions for Problem One Long Term Goal  reviewed definition of prediabetes, reviewed lifestyle changes that will assist with improved glucose control, reviewed blood readings , dicussed changes to Link To Wellness program in 2018, requested patient e-mail this RNCM the results  of her labs drawn at provider's office on 01/06/16, will arrange for Link To Wellness follow up in 6 months      Will fax note to Dr. Parke SimmersBland. Will meet with patient every 6  months to assist with prediabetes self management and assess progress toward mutually set goals.  Bary RichardJanet S. Xzavian Semmel RN,CCM,CDE [facility] Care Management Coordinator Link To Wellness Office Phone [phone] Office Fax [phone]

## 2016-03-17 DIAGNOSIS — Z7189 Other specified counseling: Secondary | ICD-10-CM | POA: Diagnosis not present

## 2016-03-17 DIAGNOSIS — Z111 Encounter for screening for respiratory tuberculosis: Secondary | ICD-10-CM | POA: Diagnosis not present

## 2016-04-30 DIAGNOSIS — Z113 Encounter for screening for infections with a predominantly sexual mode of transmission: Secondary | ICD-10-CM | POA: Diagnosis not present

## 2016-04-30 DIAGNOSIS — Z7251 High risk heterosexual behavior: Secondary | ICD-10-CM | POA: Diagnosis not present

## 2016-05-12 ENCOUNTER — Other Ambulatory Visit: Payer: Self-pay | Admitting: *Deleted

## 2016-05-12 NOTE — Patient Outreach (Addendum)
[facility] requesting she schedule Link To Wellness appointment. Await response from [place]. Bary RichardJanet S. Riane Rung RN,CCM,CDE [facility] Care Management Coordinator Link To Wellness Office Phone [phone] Office Fax [phone]

## 2016-05-21 ENCOUNTER — Ambulatory Visit: Payer: Self-pay | Admitting: *Deleted

## 2016-05-27 ENCOUNTER — Other Ambulatory Visit: Payer: Self-pay | Admitting: *Deleted

## 2016-05-27 VITALS — BP 112/82 | Ht 64.0 in | Wt 148.6 lb

## 2016-05-27 DIAGNOSIS — R7301 Impaired fasting glucose: Secondary | ICD-10-CM

## 2016-05-27 LAB — POCT CBG (FASTING - GLUCOSE)-MANUAL ENTRY: GLUCOSE FASTING, POC: 108 mg/dL — AB (ref 70–99)

## 2016-05-27 LAB — POCT GLYCOSYLATED HEMOGLOBIN (HGB A1C): Hemoglobin A1C: 6.3

## 2016-05-27 NOTE — Patient Outreach (Signed)
[facility]) Care Management   05/27/2016  Deborah Chavez 1975-12-14 [id number]  Deborah Chavez is an 40 y.o. female [facility] with her husband for routine Link To Wellness follow up for self management assistance with prediabetes and overweight.  Subjective: Deborah Chavez says she is doing well and says she has lost weight but still wants a body mass index <25.  She says checks her fasting blood sugar every week or so and it is usually <100. She has not checked a post meal blood sugar recently. She defines her hypoglycemic threshold as 90.  She denies recent asthma exacerbation and says she was diagnosed as a child. She says she last saw Dr. Parke SimmersBland in July but never received her lab results so she agrees to have a POC Hgb A1C checked today as her goal is to not develop Type II DM. 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   Objective:   Review of Systems  Constitutional: Negative.     Physical Exam  Constitutional: She is oriented to person, place, and time. She appears well-developed and well-nourished.  Respiratory: Effort normal.  Neurological: She is alert and oriented to person, place, and time.  Skin: Skin is warm and dry.  Psychiatric: She has a normal mood and affect. Her behavior is normal. Judgment and thought content normal.   Vitals:   05/27/16 1004  BP: 112/82   Filed Weights   05/27/16 1004  Weight: 148 lb 9.6 oz (67.4 kg)   POC fasting CBG= 108 POC Hgb A1C= 6.3% Encounter Medications:   Outpatient Encounter Prescriptions as of 05/27/2016  Medication Sig Note  . albuterol (PROVENTIL HFA;VENTOLIN HFA) 108 (90 BASE) MCG/ACT inhaler Inhale 2 puffs into the lungs every 6 (six) hours as needed.   . montelukast (SINGULAIR) 10 MG tablet Take 10 mg by mouth at bedtime.   . meloxicam (MOBIC) 15 MG tablet Take 1 tablet (15 mg total) by mouth at  bedtime. (Patient not taking: Reported on 05/27/2016) 03/03/2016: She took one dose and had extreme intolerance   No facility-administered encounter medications on file as of 05/27/2016.     Functional Status:   In your present state of health, do you have any difficulty performing the following activities: 03/03/2016  Hearing? N  Vision? N  Difficulty concentrating or making decisions? N  Walking or climbing stairs? N  Dressing or bathing? N  Doing errands, shopping? N  Some recent data might be hidden    Fall/Depression Screening:    PHQ 2/9 Scores 01/06/2016  PHQ - 2 Score 0    Assessment:   [employer] employee and Link To Wellness member with prediabetes today's POC Hgb A1C= 6.3% previously 6.1% despite weight loss of  6 lbs in last 3 months.   [facility]  [facility]   Flowsheet Row Most Recent Value  Care Plan Problem One  Patient with prediabetes with today's POC  Hgb A1C= 6.3%, overweight but with body mass index 25.6, previously 26.5  Role Documenting the Problem One  Care Management Coordinator  Care Plan for Problem One  Active  [facility] Long Term Goal (31-90 days)  Patient will not progress from prediabetes to Type II DM as evidenced by Hgb A1C <6.4% and CBGs meeting target at least 75% of the time, patient's body mass index will be <25 at next visit  [facility]  Long Term Goal Start Date  05/27/16  Interventions for Problem One Long Term Goal reviewed definition of prediabetes, reviewed lifestyle changes that have resulted in weight loss, reviewed blood glucose readings , dicussed changes to Link To Wellness program in 2018, discussed risks and benefits and indications of initiating Metformin therapy and encouraged Deborah Chavez to make appointment with Dr. Parke SimmersBland to discuss, ensured that Deborah Chavez has enrolled in the [facility] for prediabetes,  advised Deborah Chavez that program follow up will be via the [facility] beginning in 2018      Will fax today's note to Dr.  Parke SimmersBland.  Bary RichardJanet S. Brianna Esson RN,CCM,CDE [facility] Care Management Coordinator Link To Wellness Office Phone [phone] Office Fax [phone]

## 2016-08-11 DIAGNOSIS — N920 Excessive and frequent menstruation with regular cycle: Secondary | ICD-10-CM | POA: Diagnosis not present

## 2016-08-13 DIAGNOSIS — N920 Excessive and frequent menstruation with regular cycle: Secondary | ICD-10-CM | POA: Diagnosis not present

## 2016-08-13 DIAGNOSIS — Z6825 Body mass index (BMI) 25.0-25.9, adult: Secondary | ICD-10-CM | POA: Diagnosis not present

## 2016-08-13 DIAGNOSIS — Z1151 Encounter for screening for human papillomavirus (HPV): Secondary | ICD-10-CM | POA: Diagnosis not present

## 2016-08-13 DIAGNOSIS — R87619 Unspecified abnormal cytological findings in specimens from cervix uteri: Secondary | ICD-10-CM | POA: Diagnosis not present

## 2016-08-13 DIAGNOSIS — Z01419 Encounter for gynecological examination (general) (routine) without abnormal findings: Secondary | ICD-10-CM | POA: Diagnosis not present

## 2016-08-25 MED FILL — NORETHIN-ESTRAD-FERR 1-0.02: 1-20 | 28 days supply | Qty: 28 | Fill #0

## 2016-08-27 DIAGNOSIS — R5383 Other fatigue: Secondary | ICD-10-CM | POA: Diagnosis not present

## 2016-08-27 DIAGNOSIS — E559 Vitamin D deficiency, unspecified: Secondary | ICD-10-CM | POA: Diagnosis not present

## 2016-08-27 DIAGNOSIS — N8500 Endometrial hyperplasia, unspecified: Secondary | ICD-10-CM | POA: Diagnosis not present

## 2016-08-27 DIAGNOSIS — E118 Type 2 diabetes mellitus with unspecified complications: Secondary | ICD-10-CM | POA: Diagnosis not present

## 2016-08-27 MED FILL — ACCU-CHEK GUIDE TEST STRIP: 90 days supply | Qty: 300 | Fill #0

## 2016-08-27 MED FILL — ACCU-CHEK FASTCLIX LANCETS: 90 days supply | Qty: 306 | Fill #0

## 2016-09-26 DIAGNOSIS — R7309 Other abnormal glucose: Secondary | ICD-10-CM | POA: Diagnosis not present

## 2016-09-26 DIAGNOSIS — E559 Vitamin D deficiency, unspecified: Secondary | ICD-10-CM | POA: Diagnosis not present

## 2016-09-26 DIAGNOSIS — N8500 Endometrial hyperplasia, unspecified: Secondary | ICD-10-CM | POA: Diagnosis not present

## 2016-09-26 DIAGNOSIS — E118 Type 2 diabetes mellitus with unspecified complications: Secondary | ICD-10-CM | POA: Diagnosis not present

## 2016-09-26 DIAGNOSIS — R1030 Lower abdominal pain, unspecified: Secondary | ICD-10-CM | POA: Diagnosis not present

## 2016-09-26 DIAGNOSIS — R110 Nausea: Secondary | ICD-10-CM | POA: Diagnosis not present

## 2016-09-28 ENCOUNTER — Other Ambulatory Visit: Payer: Self-pay | Admitting: Family Medicine

## 2016-09-28 DIAGNOSIS — R1030 Lower abdominal pain, unspecified: Secondary | ICD-10-CM

## 2016-09-28 DIAGNOSIS — R110 Nausea: Secondary | ICD-10-CM | POA: Diagnosis not present

## 2016-09-28 DIAGNOSIS — R102 Pelvic and perineal pain: Secondary | ICD-10-CM | POA: Diagnosis not present

## 2016-09-28 DIAGNOSIS — R7303 Prediabetes: Secondary | ICD-10-CM | POA: Diagnosis not present

## 2016-10-01 MED FILL — NORETHIN-ESTRAD-FERR 1-0.02: 1-20 | 28 days supply | Qty: 28 | Fill #1

## 2016-10-02 ENCOUNTER — Ambulatory Visit
Admission: RE | Admit: 2016-10-02 | Discharge: 2016-10-02 | Disposition: A | Discharge status: Home / Self Care | Payer: 59 | Source: Ambulatory Visit | Attending: Family Medicine | Admitting: Family Medicine

## 2016-10-02 DIAGNOSIS — R1030 Lower abdominal pain, unspecified: Secondary | ICD-10-CM

## 2016-10-02 DIAGNOSIS — R1031 Right lower quadrant pain: Secondary | ICD-10-CM | POA: Diagnosis not present

## 2016-10-02 MED ORDER — IOPAMIDOL (ISOVUE-300) INJECTION 61%
100.0000 mL | Freq: Once | INTRAVENOUS | Status: AC | PRN
  Administered 2016-10-02: 100 mL via INTRAVENOUS

## 2016-10-28 MED FILL — NORETHIN-ESTRAD-FERR 1-0.02: 1-20 | 28 days supply | Qty: 28 | Fill #2

## 2016-11-23 DIAGNOSIS — N920 Excessive and frequent menstruation with regular cycle: Secondary | ICD-10-CM | POA: Diagnosis not present

## 2016-11-23 MED FILL — NORETHIN-ESTRAD-FERR 1-0.02: 1-20 | 84 days supply | Qty: 84 | Fill #0

## 2016-12-16 DIAGNOSIS — R1084 Generalized abdominal pain: Secondary | ICD-10-CM | POA: Diagnosis not present

## 2016-12-16 DIAGNOSIS — R1031 Right lower quadrant pain: Secondary | ICD-10-CM | POA: Diagnosis not present

## 2016-12-22 DIAGNOSIS — H5213 Myopia, bilateral: Secondary | ICD-10-CM | POA: Diagnosis not present

## 2016-12-30 DIAGNOSIS — R5383 Other fatigue: Secondary | ICD-10-CM | POA: Diagnosis not present

## 2016-12-30 DIAGNOSIS — N8500 Endometrial hyperplasia, unspecified: Secondary | ICD-10-CM | POA: Diagnosis not present

## 2016-12-30 IMAGING — MR MR KNEE*L* W/O CM
4 of 7 series · 19 of 40 positions shown · non-contrast
Comparison: Radiographs dated 01/22/2016

CLINICAL DATA: Left knee pain since jumping on a trampoline in November 2015.

EXAM:
MRI OF THE LEFT KNEE WITHOUT CONTRAST
TECHNIQUE: Multiplanar, multisequence MR imaging of the knee was performed. No
intravenous contrast was administered.

[Series 3: PD fat-sat · axial · 4.0mm · 0.27mm/px · z∈[-97,+18]mm · 6 of 24 slices shown (1 of 4)]
[im 1/24]
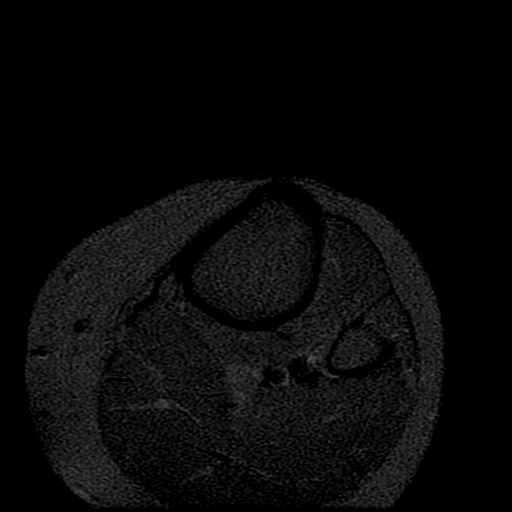
[im 5/24]
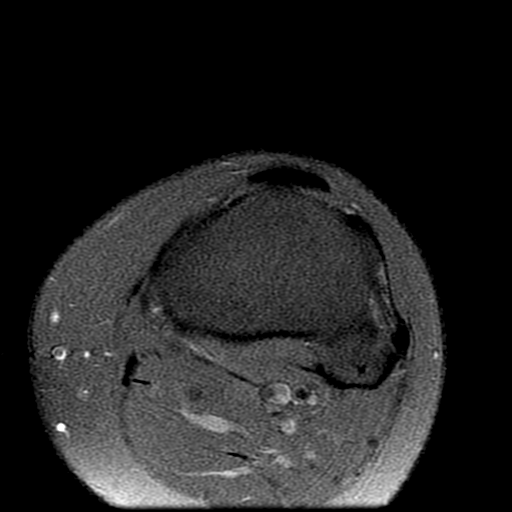
[im 10/24]
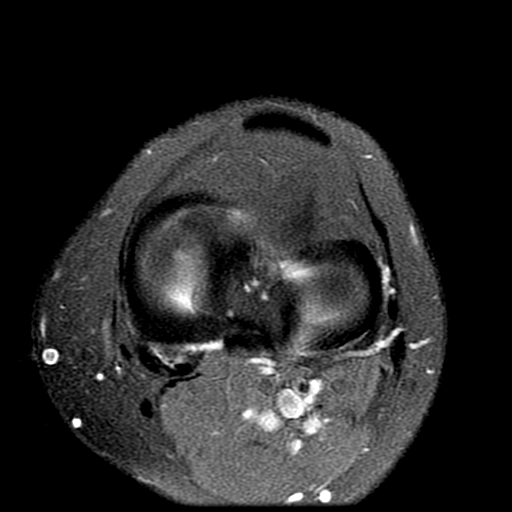
[im 14/24]
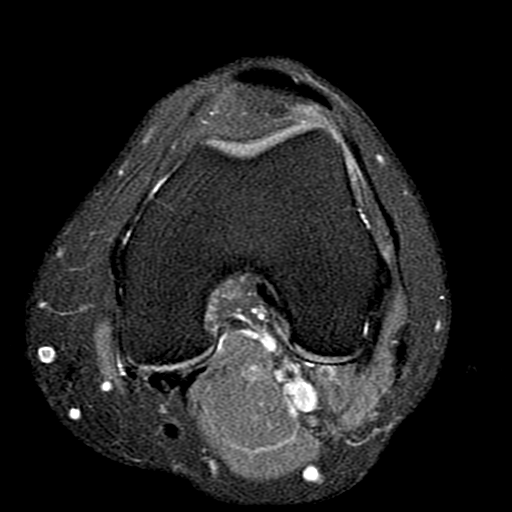
[im 19/24]
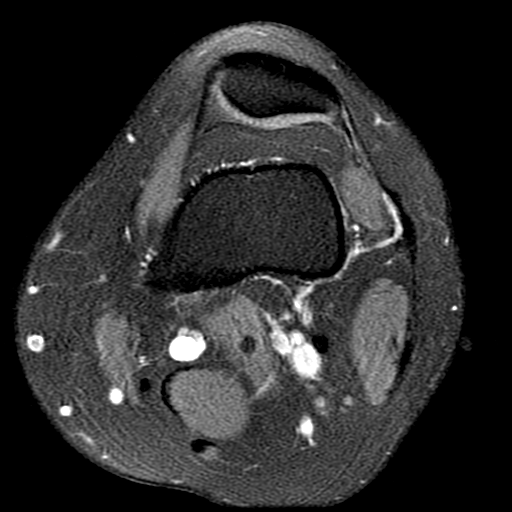
[im 24/24]
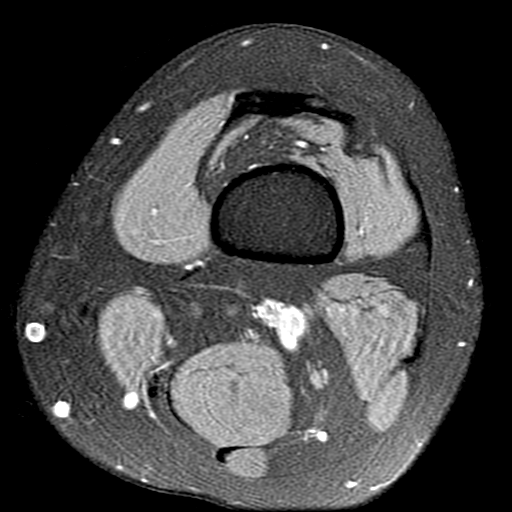

[Series 4: PD fat-sat · sagittal · 4.0mm · 0.31mm/px · 6 of 24 slices shown (2 of 4)]
[im 1/24]
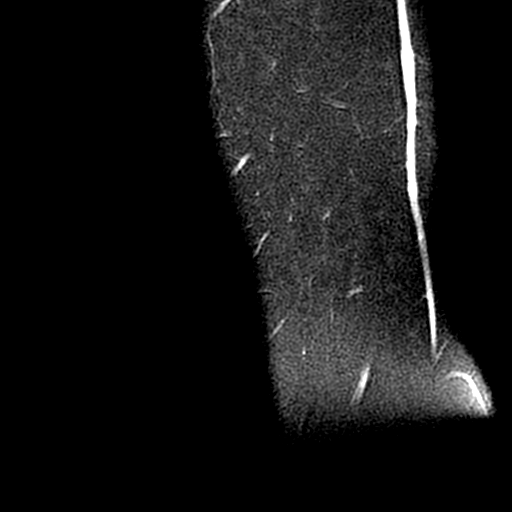
[im 5/24]
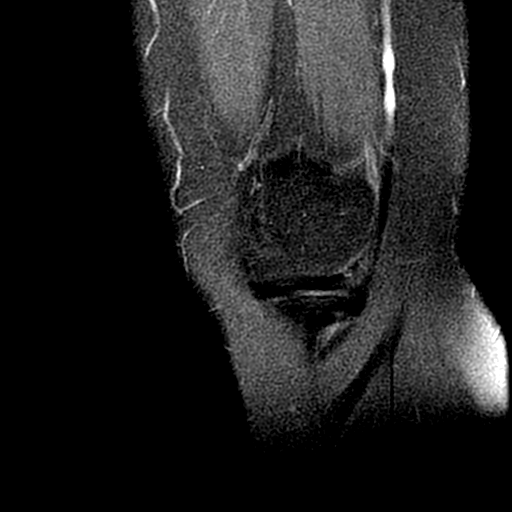
[im 10/24]
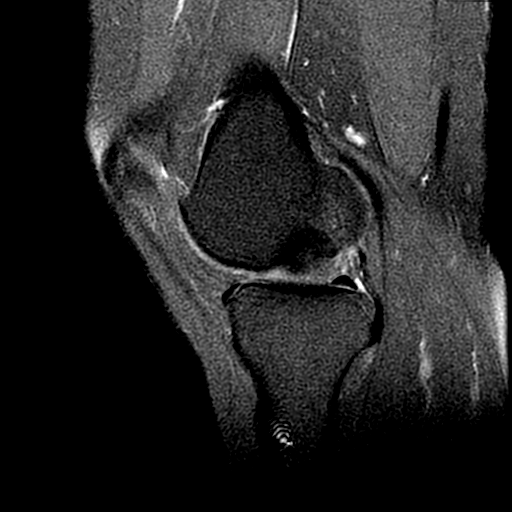
[im 14/24]
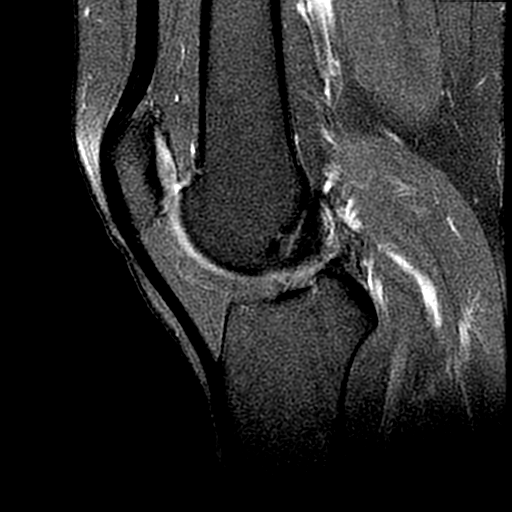
[im 19/24]
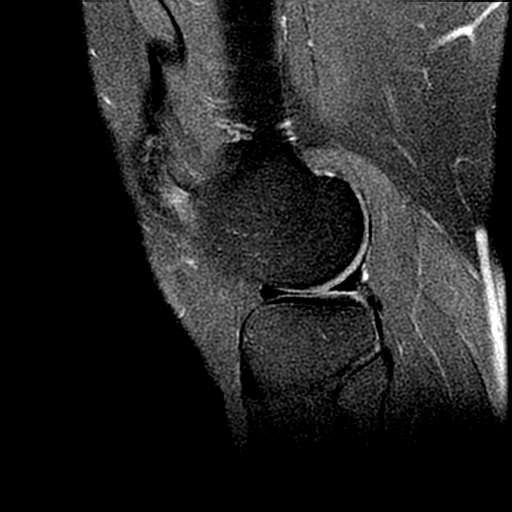
[im 24/24]
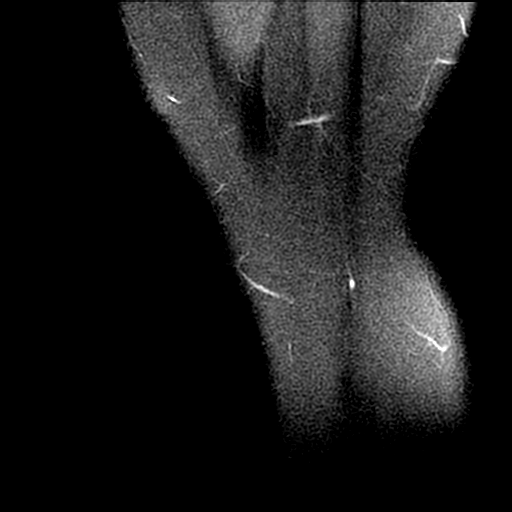

[Series 6: PD fat-sat · coronal · 2.0mm · 0.31mm/px · 4 of 16 slices shown (3 of 4)]
[im 1/16]
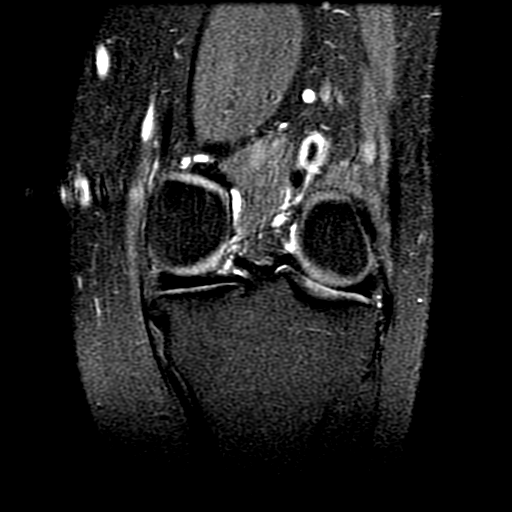
[im 6/16]
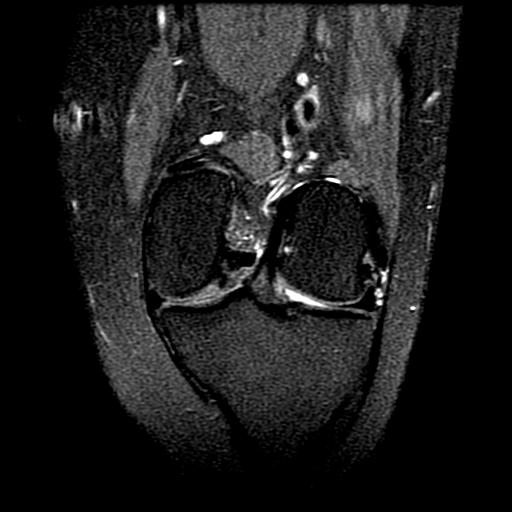
[im 11/16]
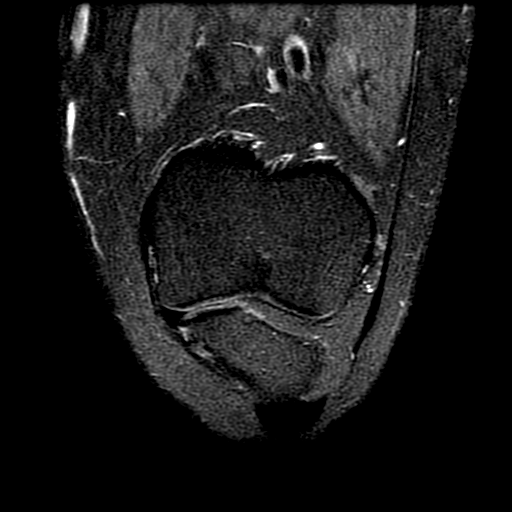
[im 16/16]
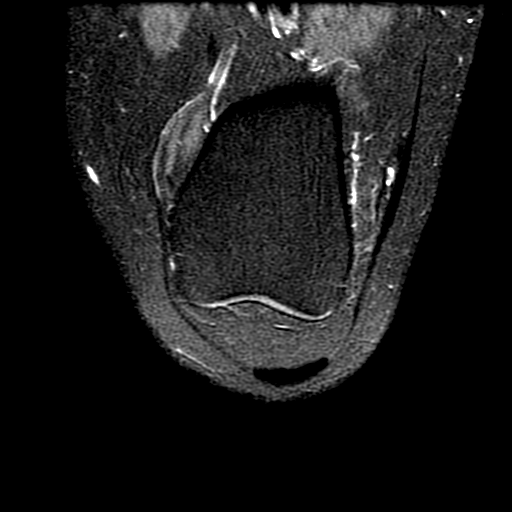

[Series 7: PD fat-sat · coronal · 4.0mm · 0.31mm/px · 3 of 24 slices shown (4 of 4)]
[im 5/24]
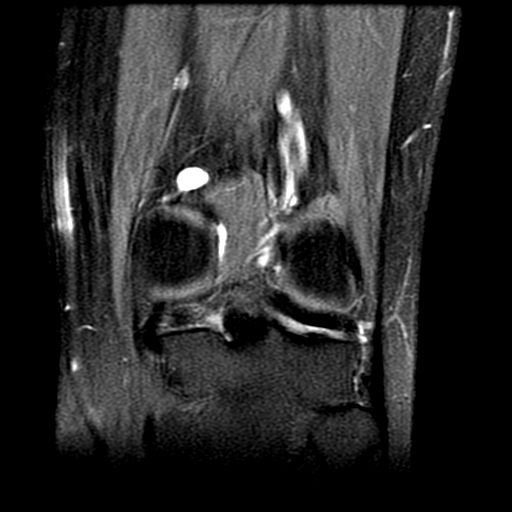
[im 14/24]
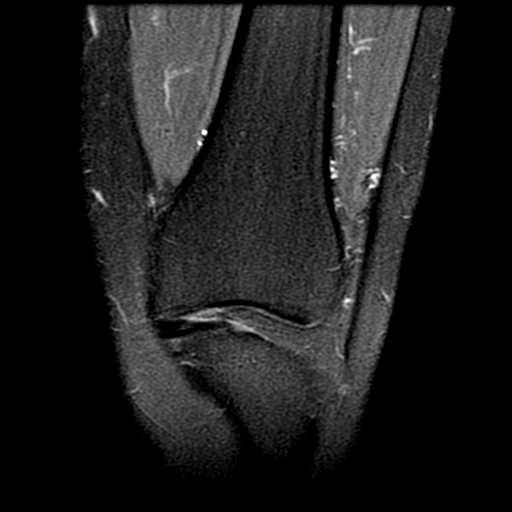
[im 24/24]
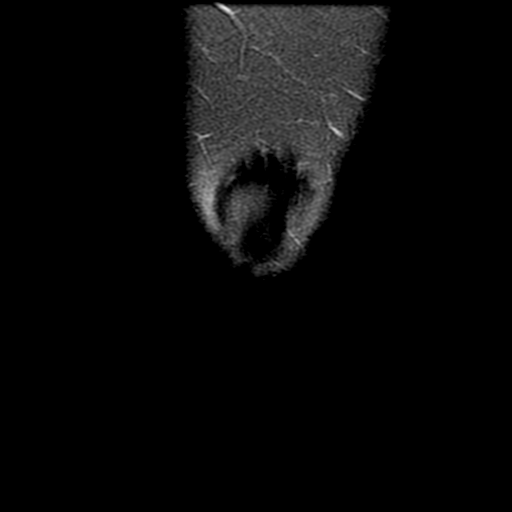

[19 of 40 positions shown; findings below may reference images not displayed]

FINDINGS: MENISCI

Medial meniscus:  Normal.

Lateral meniscus:  Normal.

LIGAMENTS

Cruciates:  Normal.

Collaterals:  Normal.

CARTILAGE

Patellofemoral: Subtle grade 1 chondromalacia superior aspect of the
lateral facet of the patella. Slight lateral patellar subluxation.
Subtle edema in the fat at the inferior lateral aspect of the
patella adjacent to the anterior aspect of the femoral condyle and
deep to the lateral patellofemoral ligament.

Medial:  Normal.

Lateral:  Normal.

Joint:  Normal.

Popliteal Fossa: 9 mm ganglion cyst adjacent to the origin of the
medial head of the gastrocnemius. No Baker's cyst.

Extensor Mechanism:  Normal.

Bones:  Normal.

Other: None
IMPRESSION: Slight soft tissue edema at the anterior lateral aspect of the
lateral femoral condyle felt to be due to friction secondary to
slight lateral patellar subluxation.

Minimal chondromalacia of the lateral facet of the patella.

## 2016-12-31 DIAGNOSIS — R1031 Right lower quadrant pain: Secondary | ICD-10-CM | POA: Diagnosis not present

## 2016-12-31 DIAGNOSIS — Z1231 Encounter for screening mammogram for malignant neoplasm of breast: Secondary | ICD-10-CM | POA: Diagnosis not present

## 2017-02-22 MED FILL — NORETHIN-ESTRAD-FERR 1-0.02: 1-20 | 84 days supply | Qty: 84 | Fill #1

## 2017-05-10 MED FILL — NORETHIN-ESTRAD-FERR 1-0.02: 1-20 | 84 days supply | Qty: 84 | Fill #2

## 2017-05-10 MED FILL — ACCU-CHEK GUIDE TEST STRIP: 90 days supply | Qty: 300 | Fill #1

## 2017-05-10 MED FILL — ACCU-CHEK FASTCLIX LANCETS: 90 days supply | Qty: 306 | Fill #1

## 2017-06-25 ENCOUNTER — Other Ambulatory Visit: Payer: Self-pay | Admitting: *Deleted

## 2017-06-25 NOTE — Patient Outreach (Signed)
Deborah JudeKeshia transitioned from the [facility] program to the [employer] digital assistant platform on 07/31/16 for prediabetes self-management assistance so will close case to the diabetes Link To Wellness program due to delegation of disease management services to [facility] from [employer] for [employer] plan members in 2019. Deborah RichardJanet S. Deborah Vila RN,CCM,CDE [facility] Care Management Coordinator Link To Wellness and [employer] Office Phone [phone] Office Fax [phone]

## 2017-08-06 MED FILL — LARIN FE 1-20 TABLET: 1-20 | 28 days supply | Qty: 28 | Fill #0

## 2017-08-19 DIAGNOSIS — L500 Allergic urticaria: Secondary | ICD-10-CM | POA: Diagnosis not present

## 2017-08-19 MED FILL — SYMBICORT 160-4.5 MCG INH: 160-4.5 | 30 days supply | Qty: 10 | Fill #0

## 2017-08-19 MED FILL — MONTELUKAST SOD 10 MG TAB: 10 | 30 days supply | Qty: 30 | Fill #0

## 2017-08-19 MED FILL — EPINEPHRINE 0.3 MG AUTO-INJ: 0.3 | 20 days supply | Qty: 2 | Fill #0

## 2017-08-27 MED FILL — LARIN FE 1-20 TABLET: 1-20 | 28 days supply | Qty: 28 | Fill #0

## 2017-08-30 DIAGNOSIS — J209 Acute bronchitis, unspecified: Secondary | ICD-10-CM | POA: Diagnosis not present

## 2017-08-30 MED FILL — AZITHROMYCIN 250 MG TABLET: 250 | 5 days supply | Qty: 6 | Fill #0

## 2017-08-31 MED FILL — predniSONE 10 MG TABS: 10 | 16 days supply | Qty: 32 | Fill #0

## 2017-09-13 DIAGNOSIS — R5383 Other fatigue: Secondary | ICD-10-CM | POA: Diagnosis not present

## 2017-09-13 DIAGNOSIS — T7840XD Allergy, unspecified, subsequent encounter: Secondary | ICD-10-CM | POA: Diagnosis not present

## 2017-09-29 MED FILL — LARIN FE 1-20 TABLET: 1-20 | 28 days supply | Qty: 28 | Fill #0

## 2017-10-26 MED FILL — LARIN FE 1-20 TABLET: 1-20 | 28 days supply | Qty: 28 | Fill #0

## 2017-11-17 DIAGNOSIS — Z01419 Encounter for gynecological examination (general) (routine) without abnormal findings: Secondary | ICD-10-CM | POA: Diagnosis not present

## 2017-11-17 DIAGNOSIS — Z0000 Encounter for general adult medical examination without abnormal findings: Secondary | ICD-10-CM | POA: Diagnosis not present

## 2017-11-17 DIAGNOSIS — Z131 Encounter for screening for diabetes mellitus: Secondary | ICD-10-CM | POA: Diagnosis not present

## 2017-11-17 DIAGNOSIS — Z13220 Encounter for screening for lipoid disorders: Secondary | ICD-10-CM | POA: Diagnosis not present

## 2017-11-26 MED FILL — BLISOVI 24 FE TABLET: 1-20 | 28 days supply | Qty: 28 | Fill #0

## 2017-12-22 MED FILL — BLISOVI 24 FE TABLET: 1-20 | 28 days supply | Qty: 28 | Fill #1

## 2017-12-30 DIAGNOSIS — Z111 Encounter for screening for respiratory tuberculosis: Secondary | ICD-10-CM | POA: Diagnosis not present

## 2018-01-13 ENCOUNTER — Ambulatory Visit (INDEPENDENT_AMBULATORY_CARE_PROVIDER_SITE_OTHER): Payer: 59 | Admitting: Physician Assistant

## 2018-01-13 ENCOUNTER — Encounter (INDEPENDENT_AMBULATORY_CARE_PROVIDER_SITE_OTHER): Payer: Self-pay | Admitting: Physician Assistant

## 2018-01-13 ENCOUNTER — Ambulatory Visit (INDEPENDENT_AMBULATORY_CARE_PROVIDER_SITE_OTHER): Payer: Self-pay

## 2018-01-13 DIAGNOSIS — M25562 Pain in left knee: Secondary | ICD-10-CM | POA: Diagnosis not present

## 2018-01-13 MED ORDER — LIDOCAINE HCL 1 % IJ SOLN
3.0000 mL | INTRAMUSCULAR | Status: AC | PRN
  Administered 2018-01-13: 3 mL

## 2018-01-13 MED ORDER — METHYLPREDNISOLONE ACETATE 40 MG/ML IJ SUSP
40.0000 mg | INTRAMUSCULAR | Status: AC | PRN
  Administered 2018-01-13: 40 mg via INTRA_ARTICULAR

## 2018-01-13 NOTE — Progress Notes (Signed)
Office Visit Note   Patient: Deborah Chavez           Date of Birth: 1976/02/16           MRN: [id number] Visit Date: 01/13/2018              Requested by: Renaye RakersBland, Veita, MD [address] [place] [id number] PCP: Renaye RakersBland, Veita, MD   Assessment & Plan: Visit Diagnoses:  1. Acute pain of left knee     Plan: We will send her to physical therapy for strengthening modalities home exercise program left knee.  See her back in about 2 weeks to check her progress lack of.  She will continue her over-the-counter NSAID and knee brace.  Questions were encouraged and answered at length.  Follow-Up Instructions: Return in about 2 weeks (around 01/27/2018).   Orders:  Orders Placed This Encounter  Procedures  . Large Joint Inj  . XR Knee 1-2 Views Left   No orders of the defined types were placed in this encounter.     Procedures: Large Joint Inj: L knee on 01/13/2018 4:18 PM Indications: pain Details: 22 G 1.5 in needle, anterolateral approach  Arthrogram: No  Medications: 3 mL lidocaine 1 %; 40 mg methylPREDNISolone acetate 40 MG/ML Outcome: tolerated well, no immediate complications Procedure, treatment alternatives, risks and benefits explained, specific risks discussed. Consent was given by the patient. Immediately prior to procedure a time out was called to verify the correct patient, procedure, equipment, support staff and site/side marked as required. Patient was prepped and draped in the usual sterile fashion.       Clinical Data: No additional findings.   Subjective: Chief Complaint  Patient presents with  . Left Knee - Pain    HPI Deborah Chavez comes in today due to left knee pain.  She was last seen in 2017 at that time had left knee pain that started after an injury at trampoline park underwent an MRI which this showed a contusion of the knee.  She is done well until few days ago she was in bed went to stretch out her knee and had a loud pop within the knee.   Has had increased pain in the knee since then.  Knee feels weak.  She did feel at that time the knee was locked and then popped and was painful.  Otherwise no mechanical symptoms.  She has a grinding sensation in the knee.  She notes no significant swelling.  She has had no new injury to the knee.   Review of Systems Denies fevers chills shortness of breath chest pain  Objective: Vital Signs: There were no vitals taken for this visit.  Physical Exam  Constitutional: She is oriented to person, place, and time. She appears well-developed and well-nourished. No distress.  Pulmonary/Chest: Effort normal.  Neurological: She is alert and oriented to person, place, and time.  Skin: She is not diaphoretic.  Psychiatric: She has a normal mood and affect.    Ortho Exam Left knee she has full extension but pain with this.  No instability of either knee with valgus varus stressing.  She has some edema of the left knee but no frank effusion.  No tenderness along medial lateral joint line of either knee.  McMurray's is negative bilaterally.  She has tenderness left knee peripatellar region and also over the patella tibial tendon.  She is able do straight leg raise on the left.  Anterior drawer is negative. Specialty  Comments:  No specialty comments available.  Imaging: Xr Knee 1-2 Views Left  Result Date: 01/13/2018 Left knee AP lateral views: No acute fracture no acute findings.  Knee is well located.  Knee overall is well-preserved.    PMFS History: Patient Active Problem List   Diagnosis Date Noted  . AGUS favor dysplasia    Past Medical History:  Diagnosis Date  . AGUS favor dysplasia   . Allergy   . Anemia   . Asthma   . Diabetes mellitus without complication (HCC)   . Prediabetes   . UTI (lower urinary tract infection)     Family History  Problem Relation Age of Onset  . Diabetes Mother   . Hyperlipidemia Mother   . Stroke Mother   . Diabetes Maternal Grandmother   . Cancer  Maternal Grandmother   . Hypertension Maternal Grandmother   . Hyperlipidemia Father   . Hypertension Father   . Stroke Father   . Cancer Sister   . Hypertension Sister   . Hyperlipidemia Maternal Grandfather   . Diabetes Maternal Grandfather   . Stroke Maternal Grandfather   . Cancer Paternal Grandmother   . Diabetes Maternal Aunt   . Diabetes Maternal Uncle   . Cancer Other   . Stroke Other   . Hypertension Other     Past Surgical History:  Procedure Laterality Date  . CESAREAN SECTION    . TUBAL LIGATION     Social History   Occupational History  . Not on file  Tobacco Use  . Smoking status: Never Smoker  Substance and Sexual Activity  . Alcohol use: No  . Drug use: No  . Sexual activity: Not on file

## 2018-01-14 ENCOUNTER — Other Ambulatory Visit (INDEPENDENT_AMBULATORY_CARE_PROVIDER_SITE_OTHER): Payer: Self-pay

## 2018-01-14 DIAGNOSIS — M25562 Pain in left knee: Principal | ICD-10-CM

## 2018-01-14 DIAGNOSIS — G8929 Other chronic pain: Secondary | ICD-10-CM

## 2018-01-19 MED FILL — BLISOVI 24 FE TABLET: 1-20 | 28 days supply | Qty: 28 | Fill #2

## 2018-01-27 ENCOUNTER — Ambulatory Visit (INDEPENDENT_AMBULATORY_CARE_PROVIDER_SITE_OTHER): Payer: 59 | Admitting: Physician Assistant

## 2018-02-16 MED FILL — BLISOVI 24 FE TABLET: 1-20 | 28 days supply | Qty: 28 | Fill #3 | Status: TO

## 2018-03-18 MED FILL — BLISOVI 24 FE TABLET: 1-20 | 28 days supply | Qty: 28 | Fill #0 | Status: TO

## 2018-04-11 MED FILL — BLISOVI 24 FE TABLET: 1-20 | 28 days supply | Qty: 28 | Fill #0

## 2018-05-10 MED FILL — BLISOVI 24 FE TABLET: 1-20 | 28 days supply | Qty: 28 | Fill #1

## 2018-06-08 MED FILL — BLISOVI 24 FE TABLET: 1-20 | 28 days supply | Qty: 28 | Fill #2

## 2018-07-08 MED FILL — BLISOVI 24 FE TABLET: 1-20 | 28 days supply | Qty: 28 | Fill #3

## 2018-08-03 MED FILL — BLISOVI 24 FE TABLET: 1-20 | 28 days supply | Qty: 28 | Fill #4

## 2018-08-09 DIAGNOSIS — J1189 Influenza due to unidentified influenza virus with other manifestations: Secondary | ICD-10-CM | POA: Diagnosis not present

## 2018-08-09 MED FILL — OSELTAMIVIR PHOS 75 MG CAP: 75 | 5 days supply | Qty: 10 | Fill #0

## 2018-08-09 MED FILL — BENZONATATE 100 MG CAPS: 100 | 7 days supply | Qty: 21 | Fill #0

## 2018-08-30 MED FILL — BLISOVI 24 FE 1-20 MG-MCG(2: 1-20 | 28 days supply | Qty: 28 | Fill #5

## 2018-09-30 MED FILL — BLISOVI 24 FE 1-20 MG-MCG(2: 1-20 | 28 days supply | Qty: 28 | Fill #6

## 2018-10-25 MED FILL — BLISOVI 24 FE 1-20 MG-MCG(2: 1-20 | 28 days supply | Qty: 28 | Fill #0

## 2018-11-05 ENCOUNTER — Other Ambulatory Visit: Payer: Self-pay | Admitting: Family Medicine

## 2018-11-05 ENCOUNTER — Other Ambulatory Visit (HOSPITAL_COMMUNITY)
Admission: RE | Admit: 2018-11-05 | Discharge: 2018-11-05 | Disposition: A | Discharge status: Home / Self Care | Payer: 59 | Source: Ambulatory Visit | Attending: Family Medicine | Admitting: Family Medicine

## 2018-11-05 DIAGNOSIS — N390 Urinary tract infection, site not specified: Secondary | ICD-10-CM | POA: Diagnosis not present

## 2018-11-05 DIAGNOSIS — E1169 Type 2 diabetes mellitus with other specified complication: Secondary | ICD-10-CM | POA: Diagnosis not present

## 2018-11-05 DIAGNOSIS — R7303 Prediabetes: Secondary | ICD-10-CM | POA: Diagnosis not present

## 2018-11-05 DIAGNOSIS — N898 Other specified noninflammatory disorders of vagina: Secondary | ICD-10-CM | POA: Insufficient documentation

## 2018-11-05 DIAGNOSIS — R7309 Other abnormal glucose: Secondary | ICD-10-CM | POA: Diagnosis not present

## 2018-11-05 DIAGNOSIS — E118 Type 2 diabetes mellitus with unspecified complications: Secondary | ICD-10-CM | POA: Diagnosis not present

## 2018-11-08 LAB — URINE CYTOLOGY ANCILLARY ONLY
Chlamydia: NEGATIVE
Neisseria Gonorrhea: NEGATIVE
Trichomonas: NEGATIVE

## 2018-11-09 LAB — URINE CYTOLOGY ANCILLARY ONLY: Candida vaginitis: NEGATIVE

## 2018-11-10 MED FILL — METRONIDAZOLE 500 MG TABS: 500 | 7 days supply | Qty: 14 | Fill #0

## 2018-11-22 DIAGNOSIS — N390 Urinary tract infection, site not specified: Secondary | ICD-10-CM | POA: Diagnosis not present

## 2018-11-22 MED FILL — SULFAMETHOXAZOLE-TMP DS TAB: 800-160 | 10 days supply | Qty: 20 | Fill #0

## 2018-11-28 MED FILL — BLISOVI 24 FE 1-20 MG-MCG(2: 1-20 | 28 days supply | Qty: 28 | Fill #1

## 2018-12-03 ENCOUNTER — Other Ambulatory Visit: Payer: Self-pay

## 2018-12-03 ENCOUNTER — Emergency Department (HOSPITAL_BASED_OUTPATIENT_CLINIC_OR_DEPARTMENT_OTHER)
Admission: EM | Admit: 2018-12-03 | Discharge: 2018-12-03 | Disposition: A | Discharge status: Home / Self Care | Payer: 59 | Attending: Emergency Medicine | Admitting: Emergency Medicine

## 2018-12-03 ENCOUNTER — Encounter (HOSPITAL_BASED_OUTPATIENT_CLINIC_OR_DEPARTMENT_OTHER): Payer: Self-pay

## 2018-12-03 DIAGNOSIS — R21 Rash and other nonspecific skin eruption: Secondary | ICD-10-CM | POA: Diagnosis present

## 2018-12-03 DIAGNOSIS — T7840XA Allergy, unspecified, initial encounter: Secondary | ICD-10-CM | POA: Diagnosis not present

## 2018-12-03 DIAGNOSIS — E119 Type 2 diabetes mellitus without complications: Secondary | ICD-10-CM | POA: Diagnosis not present

## 2018-12-03 DIAGNOSIS — J45909 Unspecified asthma, uncomplicated: Secondary | ICD-10-CM | POA: Insufficient documentation

## 2018-12-03 DIAGNOSIS — Z79899 Other long term (current) drug therapy: Secondary | ICD-10-CM | POA: Insufficient documentation

## 2018-12-03 MED ORDER — DIPHENHYDRAMINE HCL 25 MG PO CAPS
50.0000 mg | ORAL_CAPSULE | Freq: Once | ORAL | Status: AC
  Administered 2018-12-03: 50 mg via ORAL
  Filled 2018-12-03: qty 2

## 2018-12-03 NOTE — ED Provider Notes (Signed)
[facility] EMERGENCY DEPARTMENT Provider Note   CSN: [id number] Arrival date & time: 12/03/18  1424    History   Chief Complaint Chief Complaint  Patient presents with  . Allergic Reaction    HPI Deborah Chavez is a 43 y.o. female.     The history is provided by the patient.  Allergic Reaction  Presenting symptoms: itching and rash   Presenting symptoms: no difficulty breathing, no difficulty swallowing, no swelling and no wheezing   Severity:  Mild Duration:  2 days Prior allergic episodes:  Allergies to medications Context: medications (Has been on bactrim for past 10 days)   Relieved by:  Antihistamines Worsened by:  Nothing   Past Medical History:  Diagnosis Date  . AGUS favor dysplasia   . Allergy   . Anemia   . Asthma   . Diabetes mellitus without complication (HCC)   . Prediabetes   . UTI (lower urinary tract infection)     Patient Active Problem List   Diagnosis Date Noted  . AGUS favor dysplasia     Past Surgical History:  Procedure Laterality Date  . CESAREAN SECTION    . TUBAL LIGATION       OB History   No obstetric history on file.      Home Medications    Prior to Admission medications   Medication Sig Start Date End Date Taking? Authorizing Provider  albuterol (PROVENTIL HFA;VENTOLIN HFA) 108 (90 BASE) MCG/ACT inhaler Inhale 2 puffs into the lungs every 6 (six) hours as needed.    [provider]  meloxicam (MOBIC) 15 MG tablet Take 1 tablet (15 mg total) by mouth at bedtime. Patient not taking: Reported on 05/27/2016 01/06/16   Sherren Mocha, MD  montelukast (SINGULAIR) 10 MG tablet Take 10 mg by mouth at bedtime.    [provider]    Family History Family History  Problem Relation Age of Onset  . Diabetes Mother   . Hyperlipidemia Mother   . Stroke Mother   . Diabetes Maternal Grandmother   . Cancer Maternal Grandmother   . Hypertension Maternal Grandmother   . Hyperlipidemia Father   .  Hypertension Father   . Stroke Father   . Cancer Sister   . Hypertension Sister   . Hyperlipidemia Maternal Grandfather   . Diabetes Maternal Grandfather   . Stroke Maternal Grandfather   . Cancer Paternal Grandmother   . Diabetes Maternal Aunt   . Diabetes Maternal Uncle   . Cancer Other   . Stroke Other   . Hypertension Other     Social History Social History   Tobacco Use  . Smoking status: Never Smoker  . Smokeless tobacco: Never Used  Substance Use Topics  . Alcohol use: No  . Drug use: No     Allergies   Aspirin; Lactose intolerance (gi); Mobic [meloxicam]; Ultram [tramadol hcl]; and Vioxx [rofecoxib]   Review of Systems Review of Systems  Constitutional: Negative for chills and fever.  HENT: Negative for ear pain, sore throat and trouble swallowing.   Eyes: Negative for pain and visual disturbance.  Respiratory: Negative for cough, shortness of breath and wheezing.   Cardiovascular: Negative for chest pain and palpitations.  Gastrointestinal: Negative for abdominal pain and vomiting.  Genitourinary: Negative for dysuria and hematuria.  Musculoskeletal: Negative for arthralgias and back pain.  Skin: Positive for itching and rash. Negative for color change.  Neurological: Negative for seizures and syncope.  All other systems reviewed and are  negative.    Physical Exam Updated Vital Signs  ED Triage Vitals  Enc Vitals Group     BP 12/03/18 1429 123/88     Pulse Rate 12/03/18 1429 (!) 119     Resp 12/03/18 1429 18     Temp 12/03/18 1431 98.4 F (36.9 C)     Temp Source 12/03/18 1431 Oral     SpO2 12/03/18 1429 99 %     Weight 12/03/18 1429 145 lb (65.8 kg)     Height 12/03/18 1429 5\' 4"  (1.626 m)     Head Circumference --      Peak Flow --      Pain Score 12/03/18 1429 0     Pain Loc --      Pain Edu? --      Excl. in GC? --     Physical Exam Vitals signs and nursing note reviewed.  Constitutional:      General: She is not in acute distress.     Appearance: She is well-developed.  HENT:     Head: Normocephalic and atraumatic.     Comments: No lip or tongue swelling    Nose: Nose normal. No congestion or rhinorrhea.     Mouth/Throat:     Mouth: Mucous membranes are moist.     Pharynx: No oropharyngeal exudate or posterior oropharyngeal erythema.  Eyes:     Extraocular Movements: Extraocular movements intact.     Conjunctiva/sclera: Conjunctivae normal.     Pupils: Pupils are equal, round, and reactive to light.  Neck:     Musculoskeletal: Normal range of motion and neck supple.  Cardiovascular:     Rate and Rhythm: Normal rate and regular rhythm.     Pulses: Normal pulses.     Heart sounds: Normal heart sounds. No murmur.  Pulmonary:     Effort: Pulmonary effort is normal. No respiratory distress.     Breath sounds: Normal breath sounds.  Abdominal:     General: There is no distension.     Palpations: Abdomen is soft.     Tenderness: There is no abdominal tenderness.  Skin:    General: Skin is warm and dry.     Capillary Refill: Capillary refill takes less than 2 seconds.     Findings: Rash (small hives to arm and chest) present.  Neurological:     General: No focal deficit present.     Mental Status: She is alert.  Psychiatric:        Mood and Affect: Mood normal.      ED Treatments / Results  Labs (all labs ordered are listed, but only abnormal results are displayed) Labs Reviewed - No data to display  EKG None  Radiology No results found.  Procedures Procedures (including critical care time)  Medications Ordered in ED Medications  diphenhydrAMINE (BENADRYL) capsule 50 mg (50 mg Oral Given 12/03/18 1449)     Initial Impression / Assessment and Plan / ED Course  I have reviewed the triage vital signs and the nursing notes.  Pertinent labs & imaging results that were available during my care of the patient were reviewed by me and considered in my medical decision making (see chart for details).      Deborah Chavez is a 43 year old female with no significant medical history who presents to the ED with rash to bilateral arms and chest.  This has been ongoing for 2 days.  Patient was started on Bactrim about 10 days ago and rash started  just recently.  She has no respiratory symptoms.  No tongue swelling or GI symptoms.  No concern for anaphylaxis on exam.  Overall appears that she has fine rash to both arms and to her chest.  Does not appear to be chickenpox.  Possibly drug allergy.  Recommend that she discontinue antibiotic.  She was on them for urinary tract infection in which she has had no symptoms.  Recommend that she follow-up with her primary care doctor for repeat urinalysis.  She basically completed course of treatment at this time anyways.  Given a dose of Benadryl.  Patient has an EpiPen at home already for prior allergies.  Given return precautions and discharged from ED in good condition.  This chart was dictated using voice recognition software.  Despite best efforts to proofread,  errors can occur which can change the documentation meaning.    Final Clinical Impressions(s) / ED Diagnoses   Final diagnoses:  Allergic reaction, initial encounter    ED Discharge Orders    None       Virgina NorfolkCuratolo, Ellena Kamen, DO 12/03/18 1507

## 2018-12-03 NOTE — ED Triage Notes (Signed)
Pt presents with rash to arms and chest x 1 day. Pt recently started on bactrim and pharmacy advised some medications could have reactions. Pt has no resp. Distress.

## 2018-12-14 DIAGNOSIS — N390 Urinary tract infection, site not specified: Secondary | ICD-10-CM | POA: Diagnosis not present

## 2018-12-14 DIAGNOSIS — E1169 Type 2 diabetes mellitus with other specified complication: Secondary | ICD-10-CM | POA: Diagnosis not present

## 2018-12-14 MED FILL — AMOX-CLAV 500-125 MG TABLET: 500-125 | 10 days supply | Qty: 20 | Fill #0

## 2018-12-16 MED FILL — BLISOVI 24 FE 1-20 MG-MCG(2: 1-20 | 28 days supply | Qty: 28 | Fill #2

## 2019-01-10 MED FILL — BLISOVI 24 FE 1-20 MG-MCG(2: 1-20 | 28 days supply | Qty: 28 | Fill #0

## 2019-01-17 MED FILL — FLUCONAZOLE 150 MG TABS: 150 | 1 days supply | Qty: 1 | Fill #0

## 2019-01-17 MED FILL — NYSTATIN 100,000 UNIT/GM CR: 100000 | 10 days supply | Qty: 30 | Fill #0

## 2019-02-14 MED FILL — BLISOVI 24 FE 1-20 MG-MCG(2: 1-20 | 28 days supply | Qty: 28 | Fill #0

## 2019-02-15 MED FILL — MONTELUKAST SOD 10 MG TAB: 10 | 30 days supply | Qty: 30 | Fill #0

## 2019-02-15 MED FILL — ACCU-CHEK FASTCLIX LANCETS: 31 days supply | Qty: 102 | Fill #0

## 2019-02-15 MED FILL — ACCU-CHEK GUIDE STRP: 30 days supply | Qty: 100 | Fill #0

## 2019-03-14 MED FILL — BLISOVI 24 FE 1-20 MG-MCG(2: 1-20 | 28 days supply | Qty: 28 | Fill #1

## 2019-04-17 MED FILL — BLISOVI 24 FE 1-20 MG-MCG(2: 1-20 | 28 days supply | Qty: 28 | Fill #2

## 2019-05-17 MED FILL — BLISOVI 24 FE 1-20 MG-MCG(2: 1-20 | 28 days supply | Qty: 28 | Fill #3

## 2019-06-12 MED FILL — BLISOVI 24 FE 1-20 MG-MCG(2: 1-20 | 28 days supply | Qty: 28 | Fill #4

## 2019-06-26 MED FILL — SYMBICORT 160-4.5 MCG INH: 160-4.5 | 30 days supply | Qty: 10 | Fill #0

## 2019-06-26 MED FILL — MONTELUKAST SOD 10 MG TAB: 10 | 30 days supply | Qty: 30 | Fill #0

## 2019-06-26 MED FILL — ALBUTEROL SULFATE HFA 108 (: 108 (90 BAS | 17 days supply | Qty: 9 | Fill #0

## 2019-07-18 MED FILL — BLISOVI 24 FE 1-20 MG-MCG(2: 1-20 | 28 days supply | Qty: 28 | Fill #5

## 2019-08-08 MED FILL — BLISOVI 24 FE 1-20 MG-MCG(2: 1-20 | 28 days supply | Qty: 28 | Fill #6

## 2019-09-08 MED FILL — BLISOVI 24 FE 1-20 MG-MCG(2: 1-20 | 28 days supply | Qty: 28 | Fill #7

## 2019-10-02 MED FILL — BLISOVI 24 FE 1-20 MG-MCG(2: 1-20 | 28 days supply | Qty: 28 | Fill #8

## 2019-11-01 MED FILL — BLISOVI 24 FE 1-20 MG-MCG(2: 1-20 | 28 days supply | Qty: 28 | Fill #9

## 2020-01-15 ENCOUNTER — Other Ambulatory Visit (HOSPITAL_COMMUNITY): Payer: Self-pay | Admitting: Obstetrics and Gynecology

## 2020-01-15 MED FILL — METRONIDAZOLE 500 MG TABS: 500 | 7 days supply | Qty: 14 | Fill #0

## 2020-01-15 MED FILL — TARINA 24 FE 1-20 MG-MCG(24: 1-20 | 28 days supply | Qty: 28 | Fill #0

## 2020-01-22 MED FILL — FLUCONAZOLE 150 MG TABS: 150 | 1 days supply | Qty: 1 | Fill #0 | Status: TO

## 2020-01-23 MED FILL — FLUCONAZOLE 150 MG TABS: 150 | 1 days supply | Qty: 1 | Fill #0

## 2020-02-05 MED FILL — BLISOVI 24 FE 1-20 MG-MCG(2: 1-20 | 28 days supply | Qty: 28 | Fill #0

## 2020-03-28 MED FILL — ALBUTEROL SULFATE HFA 108 (: 108 (90 BAS | 17 days supply | Qty: 9 | Fill #1

## 2020-03-28 MED FILL — BLISOVI 24 FE 1-20 MG-MCG(2: 1-20 | 28 days supply | Qty: 28 | Fill #1

## 2020-03-28 MED FILL — MONTELUKAST SOD 10 MG TAB: 10 | 30 days supply | Qty: 30 | Fill #1

## 2020-03-29 ENCOUNTER — Other Ambulatory Visit (HOSPITAL_COMMUNITY): Payer: Self-pay | Admitting: Family Medicine

## 2020-04-01 MED FILL — ACCU-CHEK GUIDE STRP: 30 days supply | Qty: 100 | Fill #0

## 2020-04-01 MED FILL — ACCU-CHEK FASTCLIX LANCETS: 31 days supply | Qty: 102 | Fill #0

## 2020-04-22 MED FILL — BLISOVI 24 FE 1-20 MG-MCG(2: 1-20 | 28 days supply | Qty: 28 | Fill #2

## 2020-05-20 MED FILL — BLISOVI 24 FE 1-20 MG-MCG(2: 1-20 | 28 days supply | Qty: 28 | Fill #3

## 2020-05-27 ENCOUNTER — Emergency Department (HOSPITAL_BASED_OUTPATIENT_CLINIC_OR_DEPARTMENT_OTHER)
Admission: EM | Admit: 2020-05-27 | Discharge: 2020-05-27 | Disposition: A | Discharge status: Home / Self Care | Payer: 59 | Attending: Emergency Medicine | Admitting: Emergency Medicine

## 2020-05-27 ENCOUNTER — Emergency Department (HOSPITAL_BASED_OUTPATIENT_CLINIC_OR_DEPARTMENT_OTHER): Payer: 59

## 2020-05-27 ENCOUNTER — Encounter (HOSPITAL_BASED_OUTPATIENT_CLINIC_OR_DEPARTMENT_OTHER): Payer: Self-pay | Admitting: *Deleted

## 2020-05-27 ENCOUNTER — Other Ambulatory Visit: Payer: Self-pay

## 2020-05-27 DIAGNOSIS — R109 Unspecified abdominal pain: Secondary | ICD-10-CM

## 2020-05-27 DIAGNOSIS — Z79899 Other long term (current) drug therapy: Secondary | ICD-10-CM | POA: Insufficient documentation

## 2020-05-27 DIAGNOSIS — E119 Type 2 diabetes mellitus without complications: Secondary | ICD-10-CM | POA: Diagnosis not present

## 2020-05-27 DIAGNOSIS — J45909 Unspecified asthma, uncomplicated: Secondary | ICD-10-CM | POA: Diagnosis not present

## 2020-05-27 DIAGNOSIS — R1013 Epigastric pain: Secondary | ICD-10-CM | POA: Insufficient documentation

## 2020-05-27 DIAGNOSIS — R112 Nausea with vomiting, unspecified: Secondary | ICD-10-CM | POA: Diagnosis not present

## 2020-05-27 LAB — URINALYSIS, ROUTINE W REFLEX MICROSCOPIC
Bilirubin Urine: NEGATIVE
Glucose, UA: NEGATIVE mg/dL
Ketones, ur: NEGATIVE mg/dL
Leukocytes,Ua: NEGATIVE
Nitrite: NEGATIVE
Protein, ur: NEGATIVE mg/dL
Specific Gravity, Urine: 1.025 (ref 1.005–1.030)
pH: 6 (ref 5.0–8.0)

## 2020-05-27 LAB — CBC WITH DIFFERENTIAL/PLATELET
Abs Immature Granulocytes: 0.05 10*3/uL (ref 0.00–0.07)
Basophils Absolute: 0 10*3/uL (ref 0.0–0.1)
Basophils Relative: 0 %
Eosinophils Absolute: 0.1 10*3/uL (ref 0.0–0.5)
Eosinophils Relative: 2 %
HCT: 42.6 % (ref 36.0–46.0)
Hemoglobin: 14.4 g/dL (ref 12.0–15.0)
Immature Granulocytes: 1 %
Lymphocytes Relative: 10 %
Lymphs Abs: 0.9 10*3/uL (ref 0.7–4.0)
MCH: 31 pg (ref 26.0–34.0)
MCHC: 33.8 g/dL (ref 30.0–36.0)
MCV: 91.6 fL (ref 80.0–100.0)
Monocytes Absolute: 0.5 10*3/uL (ref 0.1–1.0)
Monocytes Relative: 5 %
Neutro Abs: 7.9 10*3/uL — ABNORMAL HIGH (ref 1.7–7.7)
Neutrophils Relative %: 82 %
Platelets: 264 10*3/uL (ref 150–400)
RBC: 4.65 MIL/uL (ref 3.87–5.11)
RDW: 12.3 % (ref 11.5–15.5)
WBC: 9.6 10*3/uL (ref 4.0–10.5)
nRBC: 0 % (ref 0.0–0.2)

## 2020-05-27 LAB — URINALYSIS, MICROSCOPIC (REFLEX): RBC / HPF: >50 RBC/hpf (ref 0–5)

## 2020-05-27 LAB — COMPREHENSIVE METABOLIC PANEL
ALT: 12 U/L (ref 0–44)
AST: 22 U/L (ref 15–41)
Albumin: 4.3 g/dL (ref 3.5–5.0)
Alkaline Phosphatase: 65 U/L (ref 38–126)
Anion gap: 13 (ref 5–15)
BUN: 13 mg/dL (ref 6–20)
CO2: 22 mmol/L (ref 22–32)
Calcium: 9.3 mg/dL (ref 8.9–10.3)
Chloride: 101 mmol/L (ref 98–111)
Creatinine, Ser: 0.8 mg/dL (ref 0.44–1.00)
GFR, Estimated: >60 mL/min (ref >60)
Glucose, Bld: 94 mg/dL (ref 70–99)
Potassium: 4.2 mmol/L (ref 3.5–5.1)
Sodium: 136 mmol/L (ref 135–145)
Total Bilirubin: 0.6 mg/dL (ref 0.3–1.2)
Total Protein: 7.9 g/dL (ref 6.5–8.1)

## 2020-05-27 LAB — PREGNANCY, URINE: Preg Test, Ur: NEGATIVE

## 2020-05-27 LAB — LIPASE, BLOOD: Lipase: 25 U/L (ref 11–51)

## 2020-05-27 MED ORDER — ONDANSETRON 4 MG PO TBDP: 4.0000 mg | ORAL_TABLET | Freq: Three times a day (TID) | ORAL | 0 refills | Status: DC | PRN

## 2020-05-27 MED ORDER — IOHEXOL 300 MG/ML  SOLN
100.0000 mL | Freq: Once | INTRAMUSCULAR | Status: AC | PRN
  Administered 2020-05-27: 100 mL via INTRAVENOUS

## 2020-05-27 MED ORDER — FAMOTIDINE IN NACL 20-0.9 MG/50ML-% IV SOLN
20.0000 mg | Freq: Once | INTRAVENOUS | Status: AC
  Administered 2020-05-27: 20 mg via INTRAVENOUS
  Filled 2020-05-27: qty 50

## 2020-05-27 MED ORDER — ONDANSETRON HCL 4 MG/2ML IJ SOLN
4.0000 mg | Freq: Once | INTRAMUSCULAR | Status: AC
  Administered 2020-05-27: 4 mg via INTRAVENOUS
  Filled 2020-05-27: qty 2

## 2020-05-27 NOTE — ED Provider Notes (Signed)
[facility] EMERGENCY DEPARTMENT Provider Note   CSN: [id number] Arrival date & time: 05/27/20  1559     History Chief Complaint  Patient presents with  . Emesis    Deborah Chavez is a 44 y.o. female presenting to emergency department with nausea and vomiting.  She reports abrupt onset of symptoms today while at work.  She woke up in her normal state of health and then while at work abruptly began having nausea with several episodes of vomiting.  She reports a few loose, but formed stools since then.  She says she has never had symptoms like this before.  She denies any fevers or chills.  She denies any sick contacts in the house.  She did not eat anything today.  She reports abdominal surgical history of a hysterectomy and tubal ligation.  She denies any other abdominal surgical histories.  She did receive both Covid vaccines.  HPI     Past Medical History:  Diagnosis Date  . AGUS favor dysplasia   . Allergy   . Anemia   . Asthma   . Diabetes mellitus without complication (HCC)   . Prediabetes   . UTI (lower urinary tract infection)     Patient Active Problem List   Diagnosis Date Noted  . AGUS favor dysplasia     Past Surgical History:  Procedure Laterality Date  . CESAREAN SECTION    . TUBAL LIGATION       OB History   No obstetric history on file.     Family History  Problem Relation Age of Onset  . Diabetes Mother   . Hyperlipidemia Mother   . Stroke Mother   . Diabetes Maternal Grandmother   . Cancer Maternal Grandmother   . Hypertension Maternal Grandmother   . Hyperlipidemia Father   . Hypertension Father   . Stroke Father   . Cancer Sister   . Hypertension Sister   . Hyperlipidemia Maternal Grandfather   . Diabetes Maternal Grandfather   . Stroke Maternal Grandfather   . Cancer Paternal Grandmother   . Diabetes Maternal Aunt   . Diabetes Maternal Uncle   . Cancer Other   . Stroke Other   . Hypertension Other     Social  History   Tobacco Use  . Smoking status: Never Smoker  . Smokeless tobacco: Never Used  Substance Use Topics  . Alcohol use: No  . Drug use: No    Home Medications Prior to Admission medications   Medication Sig Start Date End Date Taking? Authorizing Provider  albuterol (PROVENTIL HFA;VENTOLIN HFA) 108 (90 BASE) MCG/ACT inhaler Inhale 2 puffs into the lungs every 6 (six) hours as needed.    [provider]  meloxicam (MOBIC) 15 MG tablet Take 1 tablet (15 mg total) by mouth at bedtime. Patient not taking: Reported on 05/27/2016 01/06/16   Sherren Mocha, MD  montelukast (SINGULAIR) 10 MG tablet Take 10 mg by mouth at bedtime.    [provider]  ondansetron (ZOFRAN ODT) 4 MG disintegrating tablet Take 1 tablet (4 mg total) by mouth every 8 (eight) hours as needed for up to 15 doses for nausea or vomiting. 05/27/20   Terald Sleeper, MD    Allergies    Aspirin, Lactose intolerance (gi), Mobic [meloxicam], Ultram [tramadol hcl], and Vioxx [rofecoxib]  Review of Systems   Review of Systems  Constitutional: Negative for chills and fever.  Respiratory: Negative for cough and shortness of breath.   Cardiovascular: Negative  for chest pain and palpitations.  Gastrointestinal: Positive for abdominal pain, nausea and vomiting. Negative for constipation and diarrhea.  Genitourinary: Negative for dysuria and hematuria.  Musculoskeletal: Negative for arthralgias and myalgias.  Skin: Negative for color change and rash.  Neurological: Negative for seizures, syncope and light-headedness.  Psychiatric/Behavioral: Negative for agitation and confusion.  All other systems reviewed and are negative.   Physical Exam Updated Vital Signs BP 116/72 (BP Location: Right Arm)   Pulse 94   Temp 98.6 F (37 C) (Oral)   Resp 15   Ht 5\' 4"  (1.626 m)   Wt 72.6 kg   LMP 05/25/2020   SpO2 99%   BMI 27.46 kg/m   Physical Exam Vitals and nursing note reviewed.  Constitutional:       General: She is not in acute distress.    Appearance: She is well-developed.  HENT:     Head: Normocephalic and atraumatic.  Eyes:     Conjunctiva/sclera: Conjunctivae normal.     Pupils: Pupils are equal, round, and reactive to light.  Cardiovascular:     Rate and Rhythm: Normal rate and regular rhythm.     Pulses: Normal pulses.  Pulmonary:     Effort: Pulmonary effort is normal. No respiratory distress.     Breath sounds: Normal breath sounds.  Abdominal:     General: There is no distension.     Palpations: Abdomen is soft.     Tenderness: There is abdominal tenderness in the epigastric area. There is no guarding or rebound. Negative signs include Murphy's sign and McBurney's sign.  Musculoskeletal:        General: No swelling. Normal range of motion.     Cervical back: Neck supple.  Skin:    General: Skin is warm and dry.  Neurological:     General: No focal deficit present.     Mental Status: She is alert and oriented to person, place, and time.  Psychiatric:        Mood and Affect: Mood normal.        Behavior: Behavior normal.     ED Results / Procedures / Treatments   Labs (all labs ordered are listed, but only abnormal results are displayed) Labs Reviewed  CBC WITH DIFFERENTIAL/PLATELET - Abnormal; Notable for the following components:      Result Value   Neutro Abs 7.9 (*)    All other components within normal limits  URINALYSIS, ROUTINE W REFLEX MICROSCOPIC - Abnormal; Notable for the following components:   APPearance CLOUDY (*)    Hgb urine dipstick LARGE (*)    All other components within normal limits  URINALYSIS, MICROSCOPIC (REFLEX) - Abnormal; Notable for the following components:   Bacteria, UA MANY (*)    All other components within normal limits  COMPREHENSIVE METABOLIC PANEL  LIPASE, BLOOD  PREGNANCY, URINE    EKG None  Radiology CT ABDOMEN PELVIS W CONTRAST  Result Date: 05/27/2020 CLINICAL DATA:  Nausea vomiting for 1 day EXAM: CT  ABDOMEN AND PELVIS WITH CONTRAST TECHNIQUE: Multidetector CT imaging of the abdomen and pelvis was performed using the standard protocol following bolus administration of intravenous contrast. CONTRAST:  100mL OMNIPAQUE IOHEXOL 300 MG/ML  SOLN COMPARISON:  10/02/2016 FINDINGS: Lower chest: No acute pleural or parenchymal lung disease. Hepatobiliary: No focal liver abnormality is seen. No gallstones, gallbladder wall thickening, or biliary dilatation. Pancreas: Unremarkable. No pancreatic ductal dilatation or surrounding inflammatory changes. Spleen: Normal in size without focal abnormality. Adrenals/Urinary Tract: Adrenal glands are unremarkable.  Kidneys are normal, without renal calculi, focal lesion, or hydronephrosis. Bladder is unremarkable. Stomach/Bowel: No bowel obstruction or ileus. Normal appendix right lower quadrant. No bowel wall thickening or inflammatory change. Vascular/Lymphatic: No significant vascular findings are present. No enlarged abdominal or pelvic lymph nodes. Reproductive: Uterus and bilateral adnexa are unremarkable. Other: No free fluid or free gas. No abdominal wall hernia. Musculoskeletal: No acute or destructive bony lesions. Reconstructed images demonstrate no additional findings. IMPRESSION: No acute intra-abdominal or intrapelvic process. Electronically Signed   By: Sharlet Salina M.D.   On: 05/27/2020 17:48    Procedures Procedures (including critical care time)  Medications Ordered in ED Medications  ondansetron (ZOFRAN) injection 4 mg (4 mg Intravenous Given 05/27/20 1655)  famotidine (PEPCID) IVPB 20 mg premix (0 mg Intravenous Stopped 05/27/20 1744)  iohexol (OMNIPAQUE) 300 MG/ML solution 100 mL (100 mLs Intravenous Contrast Given 05/27/20 1727)    ED Course  I have reviewed the triage vital signs and the nursing notes.  Pertinent labs & imaging results that were available during my care of the patient were reviewed by me and considered in my medical decision  making (see chart for details).  This patient presents to the Emergency Department with complaint of abdominal pain. This involves an extensive number of treatment options, and is a complaint that carries with it a high risk of complications and morbidity.  The differential diagnosis includes gastritis vs constipation vs inflammatory bowel disease vs UTI vs kidney stones vs intraabdominal infection vs biliary disease vs other  I ordered, reviewed, and interpreted labs, including BMP, CBC, Lipase, all unremarkable.  WBC 9.6.  There were no immediate, life-threatening emergencies found in this labwork.  Patient's UA showed +hgb but no signs of infection. I ordered medication IV zofran and IV pepcid for abdominal pain and/or nausea I ordered imaging studies which included CT abdomen/pelvis with IV contrast I independently visualized and interpreted imaging which showed no acute intraabdominal process and the monitor tracing which showed NSR  After the interventions stated above, I reevaluated the patient and found that her nausea had improved.  Based on the patient's clinical exam, vital signs, risk factors, and ED testing, I felt that the patient's overall risk of life-threatening emergency such as bowel perforation, surgical emergency, or sepsis was quite low.   I suspect this clinical presentation is most consistent with viral gastroenteritis, but explained to the patient that this evaluation was not a definitive diagnostic workup.  I discussed outpatient follow up with primary care provider, and provided specialist office number on the patient's discharge paper if a referral was deemed necessary.  I discussed return precautions with the patient. I felt the patient was clinically stable for discharge.   Clinical Course as of May 28 933  [facility] May 27, 2020  1754 IMPRESSION: No acute intra-abdominal or intrapelvic process.   [MT]  1903 Feeling better, tolerating sips of ginger ale.  No acute  findings on workup today.  Will d/c with PCP f/u   [MT]    Clinical Course User Index [MT] Terald Sleeper, MD    Final Clinical Impression(s) / ED Diagnoses Final diagnoses:  Abdominal pain, unspecified abdominal location    Rx / DC Orders ED Discharge Orders         Ordered    ondansetron (ZOFRAN ODT) 4 MG disintegrating tablet  Every 8 hours PRN        05/27/20 1904           Terald Sleeper,  MD 05/28/20 [id number]

## 2020-05-27 NOTE — ED Triage Notes (Signed)
C/o n/v x 1 day

## 2020-05-27 NOTE — ED Notes (Signed)
Pt given ginger ale.

## 2020-06-19 MED FILL — BLISOVI 24 FE 1-20 MG-MCG(2: 1-20 | 28 days supply | Qty: 28 | Fill #4

## 2020-06-24 MED FILL — SYMBICORT 160-4.5 MCG INH: 160-4.5 | 30 days supply | Qty: 10 | Fill #1

## 2020-07-17 MED FILL — BLISOVI 24 FE 1-20 MG-MCG(2: 1-20 | 28 days supply | Qty: 28 | Fill #5

## 2020-08-14 MED FILL — BLISOVI 24 FE 1-20 MG-MCG(2: 1-20 | 28 days supply | Qty: 28 | Fill #6

## 2020-09-12 MED FILL — BLISOVI 24 FE 1-20 MG-MCG(2: 1-20 | 28 days supply | Qty: 28 | Fill #7

## 2020-09-18 ENCOUNTER — Other Ambulatory Visit: Payer: Self-pay | Admitting: Family Medicine

## 2020-09-18 DIAGNOSIS — R413 Other amnesia: Secondary | ICD-10-CM

## 2020-09-23 MED FILL — ACCU-CHEK GUIDE STRP: 30 days supply | Qty: 100 | Fill #1

## 2020-09-25 ENCOUNTER — Other Ambulatory Visit (HOSPITAL_COMMUNITY): Payer: Self-pay | Admitting: Family Medicine

## 2020-10-05 ENCOUNTER — Ambulatory Visit
Admission: RE | Admit: 2020-10-05 | Discharge: 2020-10-05 | Disposition: A | Discharge status: Home / Self Care | Payer: 59 | Source: Ambulatory Visit | Attending: Family Medicine | Admitting: Family Medicine

## 2020-10-05 ENCOUNTER — Other Ambulatory Visit: Payer: Self-pay

## 2020-10-05 DIAGNOSIS — R413 Other amnesia: Secondary | ICD-10-CM

## 2020-10-05 MED ORDER — GADOBENATE DIMEGLUMINE 529 MG/ML IV SOLN
15.0000 mL | Freq: Once | INTRAVENOUS | Status: AC | PRN
  Administered 2020-10-05: 15 mL via INTRAVENOUS

## 2020-10-07 ENCOUNTER — Other Ambulatory Visit (HOSPITAL_COMMUNITY): Payer: Self-pay | Admitting: Family Medicine

## 2020-10-08 MED FILL — FREESTYLE LIBRE 2 SENSOR MI: 14 days supply | Qty: 1 | Fill #0

## 2020-10-23 ENCOUNTER — Other Ambulatory Visit (HOSPITAL_COMMUNITY): Payer: Self-pay

## 2020-10-23 MED FILL — Continuous Glucose System Sensor: 28 days supply | Qty: 2 | Fill #0 | Status: AC

## 2020-10-24 ENCOUNTER — Other Ambulatory Visit (HOSPITAL_COMMUNITY): Payer: Self-pay

## 2020-11-05 MED FILL — Norethindrone Ace-Ethinyl Estradiol-FE Tab 1 MG-20 MCG (24): ORAL | 28 days supply | Qty: 28 | Fill #0 | Status: CN

## 2020-11-06 ENCOUNTER — Other Ambulatory Visit (HOSPITAL_COMMUNITY): Payer: Self-pay

## 2020-11-06 MED FILL — Norethindrone Ace-Ethinyl Estradiol-FE Tab 1 MG-20 MCG (24): ORAL | 28 days supply | Qty: 28 | Fill #0 | Status: CN

## 2020-11-07 ENCOUNTER — Other Ambulatory Visit (HOSPITAL_COMMUNITY): Payer: Self-pay

## 2020-11-07 MED FILL — Norethindrone Ace-Ethinyl Estradiol-FE Tab 1 MG-20 MCG (24): ORAL | 28 days supply | Qty: 28 | Fill #0 | Status: AC

## 2020-11-15 ENCOUNTER — Other Ambulatory Visit (HOSPITAL_COMMUNITY): Payer: Self-pay

## 2020-11-15 MED FILL — Continuous Glucose System Sensor: 28 days supply | Qty: 2 | Fill #1 | Status: AC

## 2020-12-05 ENCOUNTER — Other Ambulatory Visit (HOSPITAL_COMMUNITY): Payer: Self-pay

## 2020-12-05 MED FILL — Norethindrone Ace-Ethinyl Estradiol-FE Tab 1 MG-20 MCG (24): ORAL | 28 days supply | Qty: 28 | Fill #1 | Status: AC

## 2020-12-11 ENCOUNTER — Other Ambulatory Visit (HOSPITAL_COMMUNITY): Payer: Self-pay

## 2020-12-11 MED ORDER — BLISOVI 24 FE 1-20 MG-MCG(24) PO TABS
1.0000 | ORAL_TABLET | Freq: Every day | ORAL | 1 refills | Status: DC
  Filled 2020-12-11 – 2020-12-30 (×2): qty 28, 28d supply, fill #0
  Filled 2021-01-27: qty 28, 28d supply, fill #1

## 2020-12-12 ENCOUNTER — Other Ambulatory Visit (HOSPITAL_COMMUNITY): Payer: Self-pay

## 2020-12-19 ENCOUNTER — Other Ambulatory Visit (HOSPITAL_COMMUNITY): Payer: Self-pay

## 2020-12-19 MED FILL — Continuous Glucose System Sensor: 28 days supply | Qty: 2 | Fill #2 | Status: AC

## 2020-12-20 ENCOUNTER — Other Ambulatory Visit (HOSPITAL_COMMUNITY): Payer: Self-pay

## 2020-12-31 ENCOUNTER — Other Ambulatory Visit (HOSPITAL_COMMUNITY): Payer: Self-pay

## 2021-01-01 ENCOUNTER — Other Ambulatory Visit (HOSPITAL_COMMUNITY): Payer: Self-pay

## 2021-01-15 ENCOUNTER — Telehealth: Payer: 59 | Admitting: Family

## 2021-01-15 ENCOUNTER — Encounter: Payer: Self-pay | Admitting: Family

## 2021-01-15 DIAGNOSIS — U071 COVID-19: Secondary | ICD-10-CM

## 2021-01-15 MED ORDER — MOLNUPIRAVIR EUA 200MG CAPSULE
4.0000 | ORAL_CAPSULE | Freq: Two times a day (BID) | ORAL | 0 refills | Status: DC
  Filled 2021-01-15: qty 40, 5d supply, fill #0

## 2021-01-15 MED ORDER — MOLNUPIRAVIR EUA 200MG CAPSULE: 4.0000 | ORAL_CAPSULE | Freq: Two times a day (BID) | ORAL | 0 refills | Status: AC

## 2021-01-15 NOTE — Progress Notes (Signed)
Virtual Visit Consent   [name], you are scheduled for a virtual visit with a [facility] provider today.     Just as with appointments in the office, your consent must be obtained to participate.  Your consent will be active for this visit and any virtual visit you may have with one of our providers in the next 365 days.     If you have a MyChart account, a copy of this consent can be sent to you electronically.  All virtual visits are billed to your insurance company just like a traditional visit in the office.    As this is a virtual visit, video technology does not allow for your provider to perform a traditional examination.  This may limit your provider's ability to fully assess your condition.  If your provider identifies any concerns that need to be evaluated in person or the need to arrange testing (such as labs, EKG, etc.), we will make arrangements to do so.     Although advances in technology are sophisticated, we cannot ensure that it will always work on either your end or our end.  If the connection with a video visit is poor, the visit may have to be switched to a telephone visit.  With either a video or telephone visit, we are not always able to ensure that we have a secure connection.     I need to obtain your verbal consent now.   Are you willing to proceed with your visit today?    SUMEDHA Chavez has provided verbal consent on 01/15/2021 for a virtual visit (video or telephone).   Jannifer Rodney, FNP   Date: 01/15/2021 7:22 PM   Virtual Visit via Video Note   I, Jannifer Rodney, connected with  SELENA SWAMINATHAN  ([id number], 10-09-1975) on 01/15/21 at  7:30 PM EDT by a video-enabled telemedicine application and verified that I am speaking with the correct person using two identifiers.  Location: Patient: Virtual Visit Location Patient: Home Provider: Virtual Visit Location Provider: Home   I discussed the limitations of evaluation and management by telemedicine and the  availability of in person appointments. The patient expressed understanding and agreed to proceed.    History of Present Illness: Deborah Chavez is a 45 y.o. who identifies as a female who was assigned female at birth, and is being seen today for COVID. Pt states her symptoms started today and tested positive for COVID today.   HPI: Cough This is a new problem. The current episode started today. The problem has been unchanged. The problem occurs every few minutes. The cough is Productive of sputum. Associated symptoms include nasal congestion, postnasal drip, rhinorrhea, shortness of breath and wheezing. Pertinent negatives include no chills, ear congestion, ear pain, fever or headaches. The symptoms are aggravated by lying down and pollens. She has tried OTC inhaler and rest for the symptoms. The treatment provided mild relief.   Problems:  Patient Active Problem List   Diagnosis Date Noted   AGUS favor dysplasia     Allergies:  Allergies  Allergen Reactions   Aspirin    Lactose Intolerance (Gi)    Mobic [Meloxicam] Other (See Comments)    Causes extreme fatigue and sleepiness   Ultram [Tramadol Hcl]    Vioxx [Rofecoxib]    Medications:  Current Outpatient Medications:    albuterol (PROVENTIL HFA;VENTOLIN HFA) 108 (90 BASE) MCG/ACT inhaler, Inhale 2 puffs into the lungs every 6 (six) hours as needed., Disp: , Rfl:  Continuous Blood Gluc Receiver (FREESTYLE LIBRE 2 READER) DEVI, USE AS DIRECTED THREE TIMES DAILY., Disp: 1 each, Rfl: 0   Continuous Blood Gluc Sensor (FREESTYLE LIBRE 2 SENSOR) MISC, SCAN GLUCOSE BEFORE EACH MEAL, 2 HRS AFTER EACH MEAL EACH AM AND BEFORE BEDTIME EACH DAY., Disp: 1 each, Rfl: 1   Continuous Blood Gluc Sensor (FREESTYLE LIBRE 2 SENSOR) MISC, APPLY ONE SENSOR TO CLEAN NONDOMINATE ARM EVERY 14 DAYS, Disp: 2 each, Rfl: 3   glucose blood test strip, USE AS DIRECTED THREE TIMES A DAY, Disp: 100 strip, Rfl: 2   meloxicam (MOBIC) 15 MG tablet, Take 1 tablet (15 mg  total) by mouth at bedtime. (Patient not taking: Reported on 05/27/2016), Disp: 30 tablet, Rfl: 1   molnupiravir EUA 200 mg CAPS, Take 4 capsules (800 mg total) by mouth 2 (two) times daily for 5 days., Disp: 40 capsule, Rfl: 0   montelukast (SINGULAIR) 10 MG tablet, Take 10 mg by mouth at bedtime., Disp: , Rfl:    Norethindrone Acetate-Ethinyl Estrad-FE (BLISOVI 24 FE) 1-20 MG-MCG(24) tablet, Take 1 tablet by mouth daily., Disp: 28 tablet, Rfl: 1   ondansetron (ZOFRAN ODT) 4 MG disintegrating tablet, Take 1 tablet (4 mg total) by mouth every 8 (eight) hours as needed for up to 15 doses for nausea or vomiting., Disp: 15 tablet, Rfl: 0  Observations/Objective: Patient is well-developed, well-nourished in no acute distress.  Resting comfortably  at home.  Head is normocephalic, atraumatic.  No labored breathing.  Speech is clear and coherent with logical content.  Patient is alert and oriented at baseline.  Constant intermittent cough  Assessment and Plan: 1. COVID-19 virus detected - molnupiravir EUA 200 mg CAPS; Take 4 capsules (800 mg total) by mouth 2 (two) times daily for 5 days.  Dispense: 40 capsule; Refill: 0 COVID positive, rest, force fluids, tylenol as needed, Quarantine for at least 5 days and fever free, report any worsening symptoms such as increased shortness of breath, swelling, or continued high fevers.  Possible adverse effects discussed    Follow Up Instructions: I discussed the assessment and treatment plan with the patient. The patient was provided an opportunity to ask questions and all were answered. The patient agreed with the plan and demonstrated an understanding of the instructions.  A copy of instructions were sent to the patient via MyChart.  The patient was advised to call back or seek an in-person evaluation if the symptoms worsen or if the condition fails to improve as anticipated.  Time:  I spent 9 minutes with the patient via telehealth technology discussing  the above problems/concerns.    Jannifer Rodney, FNP

## 2021-01-16 ENCOUNTER — Other Ambulatory Visit (HOSPITAL_COMMUNITY): Payer: Self-pay

## 2021-01-21 ENCOUNTER — Encounter (HOSPITAL_BASED_OUTPATIENT_CLINIC_OR_DEPARTMENT_OTHER): Payer: Self-pay

## 2021-01-21 ENCOUNTER — Other Ambulatory Visit: Payer: Self-pay

## 2021-01-21 ENCOUNTER — Emergency Department (HOSPITAL_BASED_OUTPATIENT_CLINIC_OR_DEPARTMENT_OTHER)
Admission: EM | Admit: 2021-01-21 | Discharge: 2021-01-21 | Disposition: A | Discharge status: Home / Self Care | Payer: 59 | Attending: Emergency Medicine | Admitting: Emergency Medicine

## 2021-01-21 ENCOUNTER — Emergency Department (HOSPITAL_BASED_OUTPATIENT_CLINIC_OR_DEPARTMENT_OTHER): Payer: 59

## 2021-01-21 DIAGNOSIS — J45909 Unspecified asthma, uncomplicated: Secondary | ICD-10-CM | POA: Insufficient documentation

## 2021-01-21 DIAGNOSIS — R000 Tachycardia, unspecified: Secondary | ICD-10-CM | POA: Diagnosis not present

## 2021-01-21 DIAGNOSIS — E119 Type 2 diabetes mellitus without complications: Secondary | ICD-10-CM | POA: Insufficient documentation

## 2021-01-21 DIAGNOSIS — U071 COVID-19: Secondary | ICD-10-CM | POA: Diagnosis not present

## 2021-01-21 DIAGNOSIS — J4531 Mild persistent asthma with (acute) exacerbation: Secondary | ICD-10-CM | POA: Diagnosis not present

## 2021-01-21 DIAGNOSIS — R0602 Shortness of breath: Secondary | ICD-10-CM | POA: Diagnosis present

## 2021-01-21 MED ORDER — PREDNISONE 50 MG PO TABS
60.0000 mg | ORAL_TABLET | Freq: Once | ORAL | Status: AC
  Administered 2021-01-21: 21:00:00 60 mg via ORAL
  Filled 2021-01-21: qty 1

## 2021-01-21 MED ORDER — ALBUTEROL SULFATE HFA 108 (90 BASE) MCG/ACT IN AERS
4.0000 | INHALATION_SPRAY | Freq: Once | RESPIRATORY_TRACT | Status: AC
  Administered 2021-01-21: 22:00:00 4 via RESPIRATORY_TRACT
  Filled 2021-01-21: qty 6.7

## 2021-01-21 MED ORDER — PREDNISONE 20 MG PO TABS
20.0000 mg | ORAL_TABLET | Freq: Every day | ORAL | 0 refills | Status: AC
  Filled 2021-01-21: qty 5, 5d supply, fill #0

## 2021-01-21 MED ORDER — BENZONATATE 100 MG PO CAPS
100.0000 mg | ORAL_CAPSULE | Freq: Once | ORAL | Status: AC
  Administered 2021-01-21: 21:00:00 100 mg via ORAL
  Filled 2021-01-21: qty 1

## 2021-01-21 MED ORDER — BENZONATATE 100 MG PO CAPS
100.0000 mg | ORAL_CAPSULE | Freq: Three times a day (TID) | ORAL | 0 refills | Status: DC | PRN
  Filled 2021-01-21: qty 21, 7d supply, fill #0

## 2021-01-21 MED ORDER — IPRATROPIUM BROMIDE HFA 17 MCG/ACT IN AERS
2.0000 | INHALATION_SPRAY | Freq: Once | RESPIRATORY_TRACT | Status: AC
  Administered 2021-01-21: 22:00:00 2 via RESPIRATORY_TRACT
  Filled 2021-01-21: qty 12.9

## 2021-01-21 MED FILL — Continuous Glucose System Sensor: 28 days supply | Qty: 2 | Fill #3 | Status: AC

## 2021-01-21 NOTE — Discharge Instructions (Addendum)
 -  Chest x-ray did not show any signs of pneumonia or infection today.  Two prescriptions were sent to your pharmacy.  One is for Tessalon.  This is a cough medicine that helps decrease cough.  The other is for prednisone.  This is a steroid which is used to help asthma exacerbation.  Take as prescribed.  You should stop taking it if your blood sugars are  consistently over 200.  Follow-up with your primary care doctor for recheck as needed.  Thank you for allowing [place] to care for you today.  We hope you feel better soon.

## 2021-01-21 NOTE — ED Notes (Signed)
Pt ambulated in room 3 with pulse ox. Pt O2 stats range from 98% to 99% but pt had to stop every couple of steps to cough and catch her breath. Pt pulse was 89 on pulse ox. Pt denies any pain just [facility]

## 2021-01-21 NOTE — ED Triage Notes (Addendum)
Pt tested positive for COVID on Wednesday. C/o SOB and cough. Hx asthma. Unable to talk in complete sentences. Took 4 puffs of inhaler without relief

## 2021-01-21 NOTE — ED Provider Notes (Signed)
[facility] EMERGENCY DEPARTMENT Provider Note   CSN: [id number] Arrival date & time: 01/21/21  2005     History Chief Complaint  Patient presents with   Cough   Shortness of Breath    COVID +    Deborah Chavez is a 44 y.o. female with past medical history significant for prediabetes, asthma, anemia.   Patient presents to emergency department today with chief complaint of cough and shortness of breath.  Patient tested positive for COVID x6 days ago.  She was prescribed molnupiravir by PCP and finished it yesterday.  She states her cough and shortness of breath has been progressively worsening since her COVID diagnosis.  Her cough is nonproductive.  She states she feels short of breath after long coughing episodes and feels like she cannot catch her breath.  She has an albuterol inhaler that she uses at home, last used 2 hours prior to arrival.  She has a nebulizer machine however does not have the tubing connection for it.  Patient states her asthma is usually pretty well controlled.  She has not had any fevers at home.  Patient denies any chest pain, hemoptysis, hematemesis, lower extremity edema, urinary symptoms or diarrhea.  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    Past Medical History:  Diagnosis Date   AGUS favor dysplasia    Allergy    Anemia    Asthma    Diabetes mellitus without complication (HCC)    Prediabetes    UTI (lower urinary tract infection)     Patient Active Problem List   Diagnosis Date Noted   AGUS favor dysplasia     Past Surgical History:  Procedure Laterality Date   CESAREAN SECTION     TUBAL LIGATION       OB History   No obstetric history on file.     Family History  Problem Relation Age of Onset   Diabetes Mother    Hyperlipidemia Mother    Stroke Mother    Diabetes Maternal Grandmother    Cancer Maternal Grandmother    Hypertension Maternal Grandmother    Hyperlipidemia Father     Hypertension Father    Stroke Father    Cancer Sister    Hypertension Sister    Hyperlipidemia Maternal Grandfather    Diabetes Maternal Grandfather    Stroke Maternal Grandfather    Cancer Paternal Grandmother    Diabetes Maternal Aunt    Diabetes Maternal Uncle    Cancer Other    Stroke Other    Hypertension Other     Social History   Tobacco Use   Smoking status: Never   Smokeless tobacco: Never  Substance Use Topics   Alcohol use: No   Drug use: No    Home Medications Prior to Admission medications   Medication Sig Start Date End Date Taking? Authorizing Provider  benzonatate (TESSALON) 100 MG capsule Take 1 capsule (100 mg total) by mouth every 8 (eight) hours as needed for cough. 01/22/21  Yes Walisiewicz, Tyrion Glaude E, PA-C  predniSONE (DELTASONE) 20 MG tablet Take 1 tablet (20 mg total) by mouth daily for 5 days. 01/22/21 01/27/21 Yes Walisiewicz, Edel Rivero E, PA-C  albuterol (PROVENTIL HFA;VENTOLIN HFA) 108 (90 BASE) MCG/ACT inhaler Inhale 2 puffs into the lungs every 6 (six) hours as needed.    [provider]  Continuous Blood Gluc Receiver (FREESTYLE LIBRE 2 READER) DEVI USE AS DIRECTED THREE TIMES DAILY. 09/25/20 09/25/21  Renaye Rakers, MD  Continuous Blood Gluc Sensor (FREESTYLE LIBRE 2 SENSOR) MISC SCAN GLUCOSE BEFORE EACH MEAL, 2 HRS AFTER EACH MEAL EACH AM AND BEFORE BEDTIME EACH DAY. 10/07/20 10/07/21  Renaye Rakers, MD  Continuous Blood Gluc Sensor (FREESTYLE LIBRE 2 SENSOR) MISC APPLY ONE SENSOR TO CLEAN NONDOMINATE ARM EVERY 14 DAYS 09/25/20   Renaye Rakers, MD  glucose blood test strip USE AS DIRECTED THREE TIMES A DAY 03/29/20 03/29/21  Renaye Rakers, MD  meloxicam (MOBIC) 15 MG tablet Take 1 tablet (15 mg total) by mouth at bedtime. Patient not taking: Reported on 05/27/2016 01/06/16   Sherren Mocha, MD  montelukast (SINGULAIR) 10 MG tablet Take 10 mg by mouth at bedtime.    [provider]  Norethindrone Acetate-Ethinyl Estrad-FE (BLISOVI 24 FE) 1-20 MG-MCG(24)  tablet Take 1 tablet by mouth daily. 12/11/20     ondansetron (ZOFRAN ODT) 4 MG disintegrating tablet Take 1 tablet (4 mg total) by mouth every 8 (eight) hours as needed for up to 15 doses for nausea or vomiting. 05/27/20   Terald Sleeper, MD    Allergies    Aspirin, Lactose intolerance (gi), Mobic [meloxicam], Ultram [tramadol hcl], Vioxx [rofecoxib], and Sulfa antibiotics  Review of Systems   Review of Systems All other systems are reviewed and are negative for acute change except as noted in the HPI.  Physical Exam Updated Vital Signs BP (!) 131/93 (BP Location: Right Arm)   Pulse (!) 101   Temp 98.5 F (36.9 C) (Oral)   Resp (!) 24   Ht 5\' 4"  (1.626 m)   Wt 71.2 kg   LMP 01/04/2021 (Exact Date)   SpO2 100%   BMI 26.95 kg/m   Physical Exam Vitals and nursing note reviewed.  Constitutional:      General: She is not in acute distress.    Appearance: She is not ill-appearing.  HENT:     Head: Normocephalic and atraumatic.     Right Ear: Tympanic membrane and external ear normal.     Left Ear: Tympanic membrane and external ear normal.     Nose: Nose normal.     Mouth/Throat:     Mouth: Mucous membranes are moist.     Pharynx: Oropharynx is clear.  Eyes:     General: No scleral icterus.       Right eye: No discharge.        Left eye: No discharge.     Extraocular Movements: Extraocular movements intact.     Conjunctiva/sclera: Conjunctivae normal.     Pupils: Pupils are equal, round, and reactive to light.  Neck:     Vascular: No JVD.  Cardiovascular:     Rate and Rhythm: Normal rate and regular rhythm.     Pulses: Normal pulses.          Radial pulses are 2+ on the right side and 2+ on the left side.     Heart sounds: Normal heart sounds.  Pulmonary:     Comments: Symmetric chest rise.  Expiratory wheezing heard in bilateral upper lung fields.  No rales or rhonchi.  Oxygen saturation is 100% on room air.  Patient speaking in short sentences frequently interrupted  by nonproductive cough. Chest:     Chest wall: No tenderness.  Abdominal:     Comments: Abdomen is soft, non-distended, and non-tender in all quadrants. No rigidity, no guarding. No peritoneal signs.  Musculoskeletal:        General: Normal range of motion.  Cervical back: Normal range of motion.     Right lower leg: No edema.     Left lower leg: No edema.  Skin:    General: Skin is warm and dry.     Capillary Refill: Capillary refill takes less than 2 seconds.  Neurological:     Mental Status: She is oriented to person, place, and time.     GCS: GCS eye subscore is 4. GCS verbal subscore is 5. GCS motor subscore is 6.     Comments: Fluent speech, no facial droop.  Psychiatric:        Behavior: Behavior normal.    ED Results / Procedures / Treatments   Labs (all labs ordered are listed, but only abnormal results are displayed) Labs Reviewed - No data to display  EKG None  Radiology DG Chest Portable 1 View  Result Date: 01/21/2021 CLINICAL DATA:  Cough and wheezing EXAM: PORTABLE CHEST 1 VIEW COMPARISON:  02/29/2008 FINDINGS: The heart size and mediastinal contours are within normal limits. Both lungs are clear. The visualized skeletal structures are unremarkable. IMPRESSION: No active disease. Electronically Signed   By: Jasmine Pang M.D.   On: 01/21/2021 21:19    Procedures Procedures   Medications Ordered in ED Medications  predniSONE (DELTASONE) tablet 60 mg (60 mg Oral Given 01/21/21 2120)  benzonatate (TESSALON) capsule 100 mg (100 mg Oral Given 01/21/21 2120)  albuterol (VENTOLIN HFA) 108 (90 Base) MCG/ACT inhaler 4 puff (4 puffs Inhalation Given 01/21/21 2146)  ipratropium (ATROVENT HFA) inhaler 2 puff (2 puffs Inhalation Given 01/21/21 2146)    ED Course  I have reviewed the triage vital signs and the nursing notes.  Pertinent labs & imaging results that were available during my care of the patient were reviewed by me and considered in my medical decision making  (see chart for details).    MDM Rules/Calculators/A&P                          History provided by patient with additional history obtained from chart review.    Patient presenting axman exacerbation in the setting of recent COVID diagnosis.  She is afebrile here.  She was tachycardic to 101 in triage and tachypneic with respiratory rate 24.  On my exam patient is in mild respiratory distress secondary to persistent cough.  She is unable to speak more than a few words before having a long coughing fit.  She is not hypoxic at all during this time.  Her lung exam reveals expiratory wheezing heard in bilateral upper lung fields.  She has no chest tenderness or chest pain.  No lower extremity edema.  Engaged in shared decision-making with patient regarding starting work-up with treatment for asthma exacerbation.  PE considered as cause of shortness of breath however without hypoxia or chest pain and consistent tachycardia feel it is less likely especially given that she is wheezing on exam. Patient given p.o. prednisone, albuterol inhaler, ipratropium inhaler and Tessalon.  I reassessed patient and she is back to baseline.  She has normal work of breathing.  Her lungs are clear to auscultation all fields.  She has normal respiratory rate and is able to speak in full sentences.  As patient is back to baseline and has reassuring vitals and exam do not feel that further emergent work-up is needed at this time.  Patient is agreeable with this plan of care.  Will discharge home with prescription for short prednisone burst and  Tessalon.  Recommend patient keep a close eye on blood sugar while taking prednisone and discontinue if blood sugars consistently stay above 200.  I ambulated patient and she did so without any tachycardia or hypoxia.  She is stable to be discharged home.  Strict return precautions were discussed.    Portions of this note were generated with Scientist, clinical (histocompatibility and immunogenetics)Dragon dictation software. Dictation errors may  occur despite best attempts at proofreading.   Final Clinical Impression(s) / ED Diagnoses Final diagnoses:  Mild persistent asthma with exacerbation    Rx / DC Orders ED Discharge Orders          Ordered    benzonatate (TESSALON) 100 MG capsule  Every 8 hours PRN        01/21/21 2254    predniSONE (DELTASONE) 20 MG tablet  Daily        01/21/21 2254             Shanon AceWalisiewicz, Kerigan Narvaez E, PA-C 01/21/21 2344    Virgina Norfolkuratolo, Adam, DO 01/22/21 1658

## 2021-01-22 ENCOUNTER — Other Ambulatory Visit (HOSPITAL_COMMUNITY): Payer: Self-pay

## 2021-01-27 ENCOUNTER — Other Ambulatory Visit (HOSPITAL_COMMUNITY): Payer: Self-pay

## 2021-02-14 ENCOUNTER — Other Ambulatory Visit (HOSPITAL_COMMUNITY): Payer: Self-pay

## 2021-02-14 MED ORDER — FREESTYLE LIBRE 2 READER DEVI
0 refills | Status: AC
  Filled 2021-02-14: qty 1, 1d supply, fill #0

## 2021-02-24 ENCOUNTER — Other Ambulatory Visit (HOSPITAL_COMMUNITY): Payer: Self-pay

## 2021-02-26 ENCOUNTER — Other Ambulatory Visit (HOSPITAL_COMMUNITY): Payer: Self-pay | Admitting: Family Medicine

## 2021-03-03 ENCOUNTER — Other Ambulatory Visit (HOSPITAL_COMMUNITY): Payer: Self-pay

## 2021-03-04 ENCOUNTER — Other Ambulatory Visit (HOSPITAL_COMMUNITY): Payer: Self-pay

## 2021-03-13 ENCOUNTER — Other Ambulatory Visit (HOSPITAL_COMMUNITY): Payer: Self-pay

## 2021-03-13 MED ORDER — FREESTYLE LIBRE 2 SENSOR MISC
2 refills | Status: DC
  Filled 2021-03-13: qty 2, 28d supply, fill #0
  Filled 2021-04-20: qty 2, 28d supply, fill #1
  Filled 2021-05-20: qty 2, 28d supply, fill #2
  Filled 2021-06-20: qty 2, 28d supply, fill #3
  Filled 2021-07-30 – 2021-08-08 (×2): qty 2, 28d supply, fill #4
  Filled 2021-10-17: qty 2, 28d supply, fill #5
  Filled 2021-11-07: qty 2, 28d supply, fill #6
  Filled 2021-12-19: qty 2, 28d supply, fill #7
  Filled 2022-01-02 – 2022-01-12 (×2): qty 2, 28d supply, fill #8

## 2021-03-26 ENCOUNTER — Institutional Professional Consult (permissible substitution): Payer: 59 | Admitting: Pulmonary Disease

## 2021-04-21 ENCOUNTER — Other Ambulatory Visit (HOSPITAL_COMMUNITY): Payer: Self-pay

## 2021-05-20 ENCOUNTER — Other Ambulatory Visit (HOSPITAL_COMMUNITY): Payer: Self-pay

## 2021-05-26 ENCOUNTER — Ambulatory Visit: Payer: 59 | Admitting: Neurology

## 2021-05-26 ENCOUNTER — Encounter: Payer: Self-pay | Admitting: Neurology

## 2021-05-26 VITALS — Ht 64.0 in | Wt 164.0 lb

## 2021-05-26 DIAGNOSIS — R569 Unspecified convulsions: Secondary | ICD-10-CM | POA: Diagnosis not present

## 2021-05-26 NOTE — Progress Notes (Signed)
[facility]  PATIENT: Deborah Chavez DOB: October 01, 1975  REFERRING CLINICIAN: Renaye Rakers, MD HISTORY FROM: Patient  REASON FOR VISIT: Episode of amnesia.    HISTORICAL  CHIEF COMPLAINT:  Chief Complaint  Patient presents with   New Patient (Initial Visit)    Rm 12, alone, c/o memory loss episodes when blood sugar is low, PCP concerned about sz like activity while sleeping     HISTORY OF PRESENT ILLNESS:  This is a 45 year old woman with past medical history of seizures as a child, prediabetes, asthma and abnormal Pap who is presenting with new concern of amnesia.  She said in the beginning of summer she woke up thinking it was a Saturday when in fact it was Sunday, she cannot really recall anything that happened on that Saturday other than what was on her text messages but she cannot even remember sending them.  She said she checked her blood glucose monitor and it was in her 71s therefore she was sent to see endocrine. She does not currently have a diagnosis of diabetes but she is prediabetic and based on a blood glucose monitor she has not been on extended period of hypoglycemia.  Since that she report episode of gap in memory that she cannot remember, or recall, she followed up with endocrine again and was referred here to rule out seizures since he had a history of seizure as a child.  She also reported that 2 of her kids have seizure and the they are both on medication.  She denies waking up with blood in the pillow, denies waking up on the floor, denies any additional seizures since the first seizure that she had as a child, at that time she states she was not treated with medication.   Denies any history of stroke or head trauma.   OTHER MEDICAL CONDITIONS: Seizure, Prediabetes, Asthma, abnormal PAP   REVIEW OF SYSTEMS: Full 14 system review of systems performed and negative with exception of: Seizure   ALLERGIES: Allergies  Allergen Reactions   Aspirin     Lactose Intolerance (Gi)    Mobic [Meloxicam] Other (See Comments)    Causes extreme fatigue and sleepiness   Ultram [Tramadol Hcl]    Vioxx [Rofecoxib]    Sulfa Antibiotics Rash    HOME MEDICATIONS: Outpatient Medications Prior to Visit  Medication Sig Dispense Refill   albuterol (PROVENTIL HFA;VENTOLIN HFA) 108 (90 BASE) MCG/ACT inhaler Inhale 2 puffs into the lungs every 6 (six) hours as needed.     Continuous Blood Gluc Receiver (FREESTYLE LIBRE 2 READER) DEVI USE AS DIRECTED THREE TIMES DAILY. 1 each 0   Continuous Blood Gluc Receiver (FREESTYLE LIBRE 2 READER) DEVI Scan as needed for continuous glucose monitoring. 1 each 0   Continuous Blood Gluc Sensor (FREESTYLE LIBRE 2 SENSOR) MISC SCAN GLUCOSE BEFORE EACH MEAL, 2 HRS AFTER EACH MEAL EACH AM AND BEFORE BEDTIME EACH DAY. 1 each 1   Continuous Blood Gluc Sensor (FREESTYLE LIBRE 2 SENSOR) MISC APPLY ONE SENSOR TO CLEAN NONDOMINATE ARM EVERY 14 DAYS 2 each 3   Continuous Blood Gluc Sensor (FREESTYLE LIBRE 2 SENSOR) MISC Apply 1 sensor to clean nondominate arm every 14 days 6 each 2   montelukast (SINGULAIR) 10 MG tablet Take 10 mg by mouth at bedtime.     benzonatate (TESSALON) 100 MG capsule Take 1 capsule (100 mg total) by mouth every 8 (eight) hours as needed for cough. 21 capsule 0   meloxicam (MOBIC) 15 MG tablet Take  1 tablet (15 mg total) by mouth at bedtime. (Patient not taking: Reported on 05/27/2016) 30 tablet 1   ondansetron (ZOFRAN ODT) 4 MG disintegrating tablet Take 1 tablet (4 mg total) by mouth every 8 (eight) hours as needed for up to 15 doses for nausea or vomiting. 15 tablet 0   No facility-administered medications prior to visit.    PAST MEDICAL HISTORY: Past Medical History:  Diagnosis Date   AGUS favor dysplasia    Allergy    Anemia    Asthma    Diabetes mellitus without complication (HCC)    Prediabetes    UTI (lower urinary tract infection)     PAST SURGICAL HISTORY: Past Surgical History:  Procedure  Laterality Date   CESAREAN SECTION     TUBAL LIGATION      FAMILY HISTORY: Family History  Problem Relation Age of Onset   Diabetes Mother    Hyperlipidemia Mother    Stroke Mother    Diabetes Maternal Grandmother    Cancer Maternal Grandmother    Hypertension Maternal Grandmother    Hyperlipidemia Father    Hypertension Father    Stroke Father    Cancer Sister    Hypertension Sister    Hyperlipidemia Maternal Grandfather    Diabetes Maternal Grandfather    Stroke Maternal Grandfather    Cancer Paternal Grandmother    Diabetes Maternal Aunt    Diabetes Maternal Uncle    Cancer Other    Stroke Other    Hypertension Other     SOCIAL HISTORY: Social History   Socioeconomic History   Marital status: Married    Spouse name: Not on file   Number of children: Not on file   Years of education: Not on file   Highest education level: Not on file  Occupational History   Not on file  Tobacco Use   Smoking status: Never   Smokeless tobacco: Never  Substance and Sexual Activity   Alcohol use: No   Drug use: No   Sexual activity: Not on file  Other Topics Concern   Not on file  Social History Narrative   Not on file   Social Determinants of Health   Financial Resource Strain: Not on file  Food Insecurity: Not on file  Transportation Needs: Not on file  Physical Activity: Not on file  Stress: Not on file  Social Connections: Not on file  Intimate Partner Violence: Not on file    PHYSICAL EXAM  GENERAL EXAM/CONSTITUTIONAL: Vitals:  Vitals:   05/26/21 1414  Weight: 164 lb (74.4 kg)  Height: 5\' 4"  (1.626 m)   Body mass index is 28.15 kg/m. Wt Readings from Last 3 Encounters:  05/26/21 164 lb (74.4 kg)  01/21/21 157 lb (71.2 kg)  05/27/20 160 lb (72.6 kg)   Patient is in no distress; well developed, nourished and groomed; neck is supple  EYES: Pupils round and reactive to light, Visual fields full to confrontation, Extraocular movements intacts,    MUSCULOSKELETAL: Gait, strength, tone, movements noted in Neurologic exam below  NEUROLOGIC: MENTAL STATUS:  No flowsheet data found. awake, alert, oriented to person, place and time recent and remote memory intact normal attention and concentration language fluent, comprehension intact, naming intact fund of knowledge appropriate  CRANIAL NERVE:  2nd, 3rd, 4th, 6th - pupils equal and reactive to light, visual fields full to confrontation, extraocular muscles intact, no nystagmus 5th - facial sensation symmetric 7th - facial strength symmetric 8th - hearing intact 9th - palate elevates  symmetrically, uvula midline 11th - shoulder shrug symmetric 12th - tongue protrusion midline  MOTOR:  normal bulk and tone, full strength in the BUE, BLE  SENSORY:  normal and symmetric to light touch, pinprick, temperature, vibration  COORDINATION:  finger-nose-finger, fine finger movements normal  REFLEXES:  deep tendon reflexes present and symmetric  GAIT/STATION:  normal   DIAGNOSTIC DATA (LABS, IMAGING, TESTING) - I reviewed patient records, labs, notes, testing and imaging myself where available.  Lab Results  Component Value Date   WBC 9.6 05/27/2020   HGB 14.4 05/27/2020   HCT 42.6 05/27/2020   MCV 91.6 05/27/2020   PLT 264 05/27/2020      Component Value Date/Time   NA 136 05/27/2020 1624   K 4.2 05/27/2020 1624   CL 101 05/27/2020 1624   CO2 22 05/27/2020 1624   GLUCOSE 94 05/27/2020 1624   BUN 13 05/27/2020 1624   CREATININE 0.80 05/27/2020 1624   CALCIUM 9.3 05/27/2020 1624   PROT 7.9 05/27/2020 1624   ALBUMIN 4.3 05/27/2020 1624   AST 22 05/27/2020 1624   ALT 12 05/27/2020 1624   ALKPHOS 65 05/27/2020 1624   BILITOT 0.6 05/27/2020 1624   GFRNONAA >60 05/27/2020 1624   No results found for: CHOL, HDL, LDLCALC, LDLDIRECT, TRIG, CHOLHDL Lab Results  Component Value Date   HGBA1C 6.3 05/27/2016   No results found for: VITAMINB12 No results found for:  TSH   MRI Brain with and without contrast 09/2020 Normal examination    ASSESSMENT AND PLAN  45 y.o. year old female with past medical history of seizure as a child, abnormal Pap and prediabetes who is presenting with episode of amnesia.  She said the most impressive 1 was when she could not remember the entire day in the beginning of summer, since then she has been having additional episode of gap in her memory that she cannot explain.  She did follow-up with endocrinology because her sugar was low but based on her monitor she did not have any extended period of hypoglycemia.  Since she had a history of seizures as a child and currently both of her kids have seizures I think is reasonable to work-up for seizure.  She did have a brain MRI in March 2022 which was normal.  I will order a routine EEG for further evaluation.  If the EEG is normal and she continued to have these spells, therefore I will order a 24 to 48-hour EEG to capture sleep.  I will see her in 3 months for follow-up   1. Seizure-like activity (HCC)      PLAN: Routine EEG  Continue current medications  Return in 3 months    Orders Placed This Encounter  Procedures   EEG adult    No orders of the defined types were placed in this encounter.   Return in about 3 months (around 08/26/2021).    Windell Norfolk, MD 05/27/2021, 12:01 PM  [facility] [address], [address] [place], [place] [address] [phone]

## 2021-05-26 NOTE — Patient Instructions (Signed)
Routine EEG  Continue current medications  Return in 3 months

## 2021-06-03 ENCOUNTER — Ambulatory Visit: Payer: 59 | Admitting: Neurology

## 2021-06-03 ENCOUNTER — Other Ambulatory Visit: Payer: Self-pay

## 2021-06-03 DIAGNOSIS — R569 Unspecified convulsions: Secondary | ICD-10-CM

## 2021-06-03 NOTE — Procedures (Signed)
    History:  40 yof with seizure like activity  EEG classification:  Awake and asleep  Description of the recording: The background rhythms of this recording consists of a fairly well modulated medium amplitude background activity of 11 Hz. As the record progresses, the patient initially is in the waking state, but appears to enter the early stage II sleep during the recording, with rudimentary sleep spindles and vertex sharp wave activity seen. During the wakeful state, photic stimulation is performed, and no abnormal responses were seen. Hyperventilation was also performed, no abnormal response seen. No epileptiform discharges seen during this recording. There was no focal slowing. EKG monitor shows no evidence of cardiac rhythm abnormalities with a heart rate of 78.  Impression: This is a normal EEG recording in the waking and sleeping state. No evidence interictal epileptiform discharges were seen at any time during the recording.  A normal EEG does not exclude a diagnosis of epilepsy.    Windell Norfolk, MD [facility]

## 2021-06-20 ENCOUNTER — Other Ambulatory Visit (HOSPITAL_COMMUNITY): Payer: Self-pay

## 2021-07-30 ENCOUNTER — Other Ambulatory Visit (HOSPITAL_COMMUNITY): Payer: Self-pay

## 2021-08-07 ENCOUNTER — Other Ambulatory Visit (HOSPITAL_COMMUNITY): Payer: Self-pay

## 2021-08-08 ENCOUNTER — Other Ambulatory Visit (HOSPITAL_COMMUNITY): Payer: Self-pay

## 2021-08-27 ENCOUNTER — Other Ambulatory Visit (HOSPITAL_COMMUNITY): Payer: Self-pay

## 2021-08-27 ENCOUNTER — Encounter: Payer: Self-pay | Admitting: Neurology

## 2021-08-27 ENCOUNTER — Ambulatory Visit: Payer: 59 | Admitting: Neurology

## 2021-08-27 VITALS — BP 115/73 | HR 111 | Ht 64.0 in | Wt 156.5 lb

## 2021-08-27 DIAGNOSIS — R413 Other amnesia: Secondary | ICD-10-CM

## 2021-08-27 DIAGNOSIS — R569 Unspecified convulsions: Secondary | ICD-10-CM

## 2021-08-27 MED ORDER — LEVETIRACETAM 500 MG PO TABS
500.0000 mg | ORAL_TABLET | Freq: Two times a day (BID) | ORAL | 1 refills | Status: DC
Start: 2021-08-27 — End: 2021-11-24
  Filled 2021-08-27 – 2021-09-24 (×2): qty 60, 30d supply, fill #0
  Filled 2021-11-07: qty 60, 30d supply, fill #1

## 2021-08-27 NOTE — Patient Instructions (Addendum)
Trial of Keppra 500 mg BID  48 hrs ambulatory EEG  Keep a diary of the events  Return in 3 months

## 2021-08-27 NOTE — Progress Notes (Addendum)
[facility]  PATIENT: Deborah Chavez DOB: Feb 07, 1976  REFERRING CLINICIAN: Renaye Rakers, MD HISTORY FROM: Patient  REASON FOR VISIT: Episode of amnesia.    HISTORICAL  CHIEF COMPLAINT:  Chief Complaint  Patient presents with   Follow-up    Rm 13. Alone. Pt reports seizure-like activity since last visit. On 08/13/2021, patient went to work and states she could not remember getting up from chair to shred paperwork. 07/03/21 reports severe left sided headache onset with nausea and blurred vision, 07/13/21 memory los in pm felt extremely wiped out did not remember days events 07/21/21 severe right sided headache with nausea and blurred vision 07/23/21 episode of severe generalized weakness.    INTERVAL HISTORY 08/27/2021:  Patient presents today for follow-up, at last visit plan was to obtain a routine EEG which was normal, did not show any epileptiform discharge.  She continues to experience the episodes, up to 3 times per month.  On December 15 she reported left-sided headache with onset with nausea and blurry vision, on December 25 she reported memory loss in the evening she felt extremely wiped out and did not remember the day's events. Her last episode was January 25 she remembered being at work then noted that she had to shred some paper but could not find a paper to shred. Then after finding a coworker to help her, it was noted that patient already shared the paper, she does not know how and when she walked to the shredder and shred the paper.  Again denies any frank seizure or seizure-like activity.     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  She said in the beginning of summer she woke up thinking it was a Saturday when in fact it was Sunday, she cannot really recall anything that happened on that Saturday other than what  was on her text messages but she cannot even remember sending them.  She said she checked her blood glucose monitor and it was in her 14s therefore she was sent to see endocrine. She does not currently have a diagnosis of diabetes but she is prediabetic and based on a blood glucose monitor she has not been on extended period of hypoglycemia.  Since that she report episode of gap in memory that she cannot remember, or recall, she followed up with endocrine again and was referred here to rule out seizures since he had a history of seizure as a child.  She also reported that 2 of her kids have seizure and the they are both on medication.  She denies waking up with blood in the pillow, denies waking up on the floor, denies any additional seizures since the first seizure that she had as a child, at that time she states she was not treated with medication.   Denies any history of stroke or head trauma.    OTHER MEDICAL CONDITIONS: Seizure, Prediabetes, Asthma, abnormal PAP   REVIEW OF SYSTEMS: Full 14 system review of systems performed and negative with exception of: Seizure   ALLERGIES: Allergies  Allergen Reactions   Aspirin    Lactose     Other reaction(s): GI Upset (intolerance)   Lactose Intolerance (Gi)    Mobic [Meloxicam] Other (See Comments)    Causes extreme fatigue and sleepiness   Tramadol Hcl     Other reaction(s): Other (See Comments) EKG  changes   Ultram [Tramadol Hcl]    Vioxx [Rofecoxib]    Other Rash    TB test causes rashes and streaks    Sulfa Antibiotics Rash    HOME MEDICATIONS: Outpatient Medications Prior to Visit  Medication Sig Dispense Refill   albuterol (PROVENTIL HFA;VENTOLIN HFA) 108 (90 BASE) MCG/ACT inhaler Inhale 2 puffs into the lungs every 6 (six) hours as needed.     Continuous Blood Gluc Receiver (FREESTYLE LIBRE 2 READER) DEVI USE AS DIRECTED THREE TIMES DAILY. 1 each 0   Continuous Blood Gluc Receiver (FREESTYLE LIBRE 2 READER) DEVI Scan as needed for  continuous glucose monitoring. 1 each 0   Continuous Blood Gluc Sensor (FREESTYLE LIBRE 2 SENSOR) MISC SCAN GLUCOSE BEFORE EACH MEAL, 2 HRS AFTER EACH MEAL EACH AM AND BEFORE BEDTIME EACH DAY. 1 each 1   Continuous Blood Gluc Sensor (FREESTYLE LIBRE 2 SENSOR) MISC APPLY ONE SENSOR TO CLEAN NONDOMINATE ARM EVERY 14 DAYS 2 each 3   Continuous Blood Gluc Sensor (FREESTYLE LIBRE 2 SENSOR) MISC Apply 1 sensor to clean nondominate arm every 14 days 6 each 2   montelukast (SINGULAIR) 10 MG tablet Take 10 mg by mouth at bedtime.     No facility-administered medications prior to visit.    PAST MEDICAL HISTORY: Past Medical History:  Diagnosis Date   AGUS favor dysplasia    Allergy    Anemia    Asthma    Diabetes mellitus without complication (HCC)    Prediabetes    UTI (lower urinary tract infection)     PAST SURGICAL HISTORY: Past Surgical History:  Procedure Laterality Date   CESAREAN SECTION     TUBAL LIGATION      FAMILY HISTORY: Family History  Problem Relation Age of Onset   Diabetes Mother    Hyperlipidemia Mother    Stroke Mother    Diabetes Maternal Grandmother    Cancer Maternal Grandmother    Hypertension Maternal Grandmother    Hyperlipidemia Father    Hypertension Father    Stroke Father    Cancer Sister    Hypertension Sister    Hyperlipidemia Maternal Grandfather    Diabetes Maternal Grandfather    Stroke Maternal Grandfather    Cancer Paternal Grandmother    Diabetes Maternal Aunt    Diabetes Maternal Uncle    Cancer Other    Stroke Other    Hypertension Other     SOCIAL HISTORY: Social History   Socioeconomic History   Marital status: Married    Spouse name: Not on file   Number of children: Not on file   Years of education: Not on file   Highest education level: Not on file  Occupational History   Not on file  Tobacco Use   Smoking status: Never   Smokeless tobacco: Never  Substance and Sexual Activity   Alcohol use: No   Drug use: No    Sexual activity: Not on file  Other Topics Concern   Not on file  Social History Narrative   Not on file   Social Determinants of Health   Financial Resource Strain: Not on file  Food Insecurity: Not on file  Transportation Needs: Not on file  Physical Activity: Not on file  Stress: Not on file  Social Connections: Not on file  Intimate Partner Violence: Not on file    PHYSICAL EXAM  GENERAL EXAM/CONSTITUTIONAL: Vitals:  Vitals:   08/27/21 1452  BP: 115/73  Pulse: (!) 111  Weight: 156 lb 8  oz (71 kg)  Height: 5\' 4"  (1.626 m)   Body mass index is 26.86 kg/m. Wt Readings from Last 3 Encounters:  08/27/21 156 lb 8 oz (71 kg)  05/26/21 164 lb (74.4 kg)  01/21/21 157 lb (71.2 kg)   Patient is in no distress; well developed, nourished and groomed; neck is supple  EYES: Pupils round and reactive to light, Visual fields full to confrontation, Extraocular movements intacts,   MUSCULOSKELETAL: Gait, strength, tone, movements noted in Neurologic exam below  NEUROLOGIC: MENTAL STATUS:  No flowsheet data found. awake, alert, oriented to person, place and time recent and remote memory intact normal attention and concentration language fluent, comprehension intact, naming intact fund of knowledge appropriate  CRANIAL NERVE:  2nd, 3rd, 4th, 6th - pupils equal and reactive to light, visual fields full to confrontation, extraocular muscles intact, no nystagmus 5th - facial sensation symmetric 7th - facial strength symmetric 8th - hearing intact 9th - palate elevates symmetrically, uvula midline 11th - shoulder shrug symmetric 12th - tongue protrusion midline  MOTOR:  normal bulk and tone, full strength in the BUE, BLE  SENSORY:  normal and symmetric to light touch, pinprick, temperature, vibration  COORDINATION:  finger-nose-finger, fine finger movements normal  REFLEXES:  deep tendon reflexes present and symmetric  GAIT/STATION:  normal   DIAGNOSTIC DATA  (LABS, IMAGING, TESTING) - I reviewed patient records, labs, notes, testing and imaging myself where available.  Lab Results  Component Value Date   WBC 9.6 05/27/2020   HGB 14.4 05/27/2020   HCT 42.6 05/27/2020   MCV 91.6 05/27/2020   PLT 264 05/27/2020      Component Value Date/Time   NA 136 05/27/2020 1624   K 4.2 05/27/2020 1624   CL 101 05/27/2020 1624   CO2 22 05/27/2020 1624   GLUCOSE 94 05/27/2020 1624   BUN 13 05/27/2020 1624   CREATININE 0.80 05/27/2020 1624   CALCIUM 9.3 05/27/2020 1624   PROT 7.9 05/27/2020 1624   ALBUMIN 4.3 05/27/2020 1624   AST 22 05/27/2020 1624   ALT 12 05/27/2020 1624   ALKPHOS 65 05/27/2020 1624   BILITOT 0.6 05/27/2020 1624   GFRNONAA >60 05/27/2020 1624   No results found for: CHOL, HDL, LDLCALC, LDLDIRECT, TRIG, CHOLHDL Lab Results  Component Value Date   HGBA1C 6.3 05/27/2016   No results found for: VITAMINB12 No results found for: TSH   MRI Brain with and without contrast 09/2020 Normal examination   Routine EEG 06/03/2021:  This is a normal EEG recording in the waking and sleeping state. No evidence interictal epileptiform discharges were seen at any time during the recording.  A normal EEG does not exclude a diagnosis of epilepsy.  ASSESSMENT AND PLAN  46 y.o. year old female with past medical history of seizure as a child, abnormal Pap and prediabetes who is presenting for follow up for period of amnesia. She is having up to 3  episodes per month. Because of her history of seizures, I am concerned these are post ictal confusion. I will order a 48 hrs ambulatory EEG and also trial her on Keppra 500 mg BID. I advised her to keep a diary of her events while on Keppra and I will see her in 3 months for follow up.She comfortable with plans.     1. Seizure-like activity (HCC)   2. Amnesia     Patient Instructions  Trial of Keppra 500 mg BID  48 hrs ambulatory EEG  Keep a diary of the  events  Return in 3 months      Orders Placed This Encounter  Procedures   AMBULATORY EEG    Meds ordered this encounter  Medications   levETIRAcetam (KEPPRA) 500 MG tablet    Sig: Take 1 tablet (500 mg total) by mouth 2 (two) times daily.    Dispense:  180 tablet    Refill:  1     Return in about 3 months (around 11/24/2021).    Windell NorfolkAmadou Alivea Gladson, MD 08/27/2021, 4:38 PM  [facility] [address], [address] [place], [place] [address] [phone]

## 2021-08-28 ENCOUNTER — Other Ambulatory Visit (HOSPITAL_COMMUNITY): Payer: Self-pay

## 2021-08-28 MED ORDER — GUAIFENESIN-CODEINE 100-10 MG/5ML PO SOLN
ORAL | 0 refills | Status: DC
  Filled 2021-08-28: qty 200, 7d supply, fill #0

## 2021-08-28 MED ORDER — PREDNISONE 20 MG PO TABS
ORAL_TABLET | ORAL | 0 refills | Status: DC
Start: 2021-08-28 — End: 2021-11-24
  Filled 2021-08-28: qty 16, 20d supply, fill #0

## 2021-08-29 ENCOUNTER — Other Ambulatory Visit (HOSPITAL_COMMUNITY): Payer: Self-pay

## 2021-09-04 ENCOUNTER — Other Ambulatory Visit (HOSPITAL_COMMUNITY): Payer: Self-pay

## 2021-09-09 ENCOUNTER — Other Ambulatory Visit (HOSPITAL_COMMUNITY): Payer: Self-pay

## 2021-09-24 ENCOUNTER — Other Ambulatory Visit (HOSPITAL_COMMUNITY): Payer: Self-pay

## 2021-09-25 ENCOUNTER — Other Ambulatory Visit (HOSPITAL_COMMUNITY): Payer: Self-pay

## 2021-10-06 ENCOUNTER — Telehealth: Payer: Self-pay

## 2021-10-08 NOTE — Telephone Encounter (Signed)
Patient never seen in our clinic

## 2021-10-17 ENCOUNTER — Other Ambulatory Visit (HOSPITAL_COMMUNITY): Payer: Self-pay

## 2021-11-07 ENCOUNTER — Other Ambulatory Visit (HOSPITAL_COMMUNITY): Payer: Self-pay

## 2021-11-14 ENCOUNTER — Other Ambulatory Visit (HOSPITAL_COMMUNITY): Payer: Self-pay

## 2021-11-14 MED ORDER — OZEMPIC (0.25 OR 0.5 MG/DOSE) 2 MG/3ML ~~LOC~~ SOPN
0.5000 mg | PEN_INJECTOR | SUBCUTANEOUS | 1 refills | Status: DC
  Filled 2021-11-14: qty 3, 28d supply, fill #0
  Filled 2021-12-29: qty 3, 28d supply, fill #1
  Filled 2022-01-27: qty 3, 28d supply, fill #2
  Filled 2022-02-25: qty 3, 28d supply, fill #3
  Filled 2022-03-25: qty 3, 28d supply, fill #4
  Filled 2022-04-26: qty 3, 28d supply, fill #5

## 2021-11-24 ENCOUNTER — Other Ambulatory Visit (HOSPITAL_COMMUNITY): Payer: Self-pay

## 2021-11-24 ENCOUNTER — Ambulatory Visit: Payer: 59 | Admitting: Neurology

## 2021-11-24 ENCOUNTER — Encounter: Payer: Self-pay | Admitting: Neurology

## 2021-11-24 VITALS — BP 111/70 | HR 106 | Ht 64.0 in | Wt 151.0 lb

## 2021-11-24 DIAGNOSIS — R569 Unspecified convulsions: Secondary | ICD-10-CM | POA: Diagnosis not present

## 2021-11-24 MED ORDER — LEVETIRACETAM 1000 MG PO TABS
1000.0000 mg | ORAL_TABLET | Freq: Two times a day (BID) | ORAL | 11 refills | Status: DC
  Filled 2021-11-24: qty 60, 30d supply, fill #0
  Filled 2022-01-02: qty 60, 30d supply, fill #1
  Filled 2022-01-27: qty 60, 30d supply, fill #2
  Filled 2022-02-25: qty 60, 30d supply, fill #3
  Filled 2022-04-03: qty 60, 30d supply, fill #4
  Filled 2022-04-26: qty 60, 30d supply, fill #5
  Filled 2022-05-24: qty 60, 30d supply, fill #6
  Filled 2022-07-06: qty 60, 30d supply, fill #7
  Filled 2022-08-04 – 2022-09-02 (×2): qty 60, 30d supply, fill #8
  Filled 2022-09-27: qty 60, 30d supply, fill #9
  Filled 2022-11-09: qty 60, 30d supply, fill #10

## 2021-11-24 NOTE — Patient Instructions (Signed)
Increase Keppra to 1000 mg twice daily ?48-hour ambulatory EEG ?Keep a diary of all events ?Follow-up in 3 months. ?

## 2021-11-24 NOTE — Progress Notes (Signed)
? ?[facility] ? ?PATIENT: Deborah Chavez ?DOB: 06/10/1976 ? ?REFERRING CLINICIAN: Lucianne Lei, MD ?HISTORY FROM: Patient  ?REASON FOR VISIT: Episode of amnesia.  ? ? ?HISTORICAL ? ?CHIEF COMPLAINT:  ?Chief Complaint  ?Patient presents with  ? Follow-up  ?  Room 15, alone ?Pt reports an episodes in march and April  ?States she sometimes forgets to take medication   ? ? ?INTERVAL HISTORY 11/24/2021:  ?Patient presents today for follow-up, last visit was February 8.  At that time she was reporting 3 events per month.  I started her on levetiracetam 500 mg twice daily and since then she is reporting 2 events per month.  There are instances where she will forget to take her medication but she is not sure if those event was related to the fact that she is forgetting the medication.  There was one event when she reported waking up with tongue biting and muscle soreness.  She denies any side effect from the [place]. ? ? ?INTERVAL HISTORY 08/27/2021:  ?Patient presents today for follow-up, at last visit plan was to obtain a routine EEG which was normal, did not show any epileptiform discharge.  She continues to experience the episodes, up to 3 times per month.  On December 15 she reported left-sided headache with onset with nausea and blurry vision, on December 25 she reported memory loss in the evening she felt extremely wiped out and did not remember the day's events. Her last episode was January 25 she remembered being at work then noted that she had to shred some paper but could not find a paper to shred. Then after finding a coworker to help her, it was noted that patient already shared the paper, she does not know how and when she walked to the shredder and shred the paper.  Again denies any frank seizure or seizure-like activity.   ? ? ?HISTORY OF PRESENT ILLNESS:  ?This is a 46 year old woman with past medical history of seizures as a child, prediabetes, asthma and abnormal Pap who is presenting with  new concern of amnesia.  She said in the beginning of summer she woke up thinking it was a Saturday when in fact it was Sunday, she cannot really recall anything that happened on that Saturday other than what was on her text messages but she cannot even remember sending them.  She said she checked her blood glucose monitor and it was in her 57s therefore she was sent to see endocrine. She does not currently have a diagnosis of diabetes but she is prediabetic and based on a blood glucose monitor she has not been on extended period of hypoglycemia.  Since that she report episode of gap in memory that she cannot remember, or recall, she followed up with endocrine again and was referred here to rule out seizures since he had a history of seizure as a child.  She also reported that 2 of her kids have seizure and the they are both on medication.  She denies waking up with blood in the pillow, denies waking up on the floor, denies any additional seizures since the first seizure that she had as a child, at that time she states she was not treated with medication.   ?Denies any history of stroke or head trauma.  ? ? ?OTHER MEDICAL CONDITIONS: Seizure, Prediabetes, Asthma, abnormal PAP ? ? ?REVIEW OF SYSTEMS: Full 14 system review of systems performed and negative with exception of: Seizure  ? ?ALLERGIES: ?Allergies  ?Allergen  Reactions  ? Aspirin   ? Lactose   ?  Other reaction(s): GI Upset (intolerance)  ? Lactose Intolerance (Gi)   ? Mobic [Meloxicam] Other (See Comments)  ?  Causes extreme fatigue and sleepiness  ? Tramadol Hcl   ?  Other reaction(s): Other (See Comments) ?EKG changes  ? Ultram [Tramadol Hcl]   ? Vioxx [Rofecoxib]   ? Other Rash  ?  TB test causes rashes and streaks   ? Sulfa Antibiotics Rash  ? ? ?HOME MEDICATIONS: ?Outpatient Medications Prior to Visit  ?Medication Sig Dispense Refill  ? albuterol (PROVENTIL HFA;VENTOLIN HFA) 108 (90 BASE) MCG/ACT inhaler Inhale 2 puffs into the lungs every 6 (six)  hours as needed.    ? Continuous Blood Gluc Receiver (FREESTYLE LIBRE 2 READER) DEVI Scan as needed for continuous glucose monitoring. 1 each 0  ? Continuous Blood Gluc Sensor (FREESTYLE LIBRE 2 SENSOR) MISC APPLY ONE SENSOR TO CLEAN NONDOMINATE ARM EVERY 14 DAYS 2 each 3  ? Continuous Blood Gluc Sensor (FREESTYLE LIBRE 2 SENSOR) MISC Apply 1 sensor to clean nondominate arm every 14 days 6 each 2  ? montelukast (SINGULAIR) 10 MG tablet Take 10 mg by mouth at bedtime.    ? Semaglutide,0.25 or 0.5MG /DOS, (OZEMPIC, 0.25 OR 0.5 MG/DOSE,) 2 MG/3ML SOPN Inject 0.5 mg into the skin once a week. 9 mL 1  ? levETIRAcetam (KEPPRA) 500 MG tablet Take 1 tablet (500 mg total) by mouth 2 (two) times daily. 180 tablet 1  ? guaiFENesin-codeine 100-10 MG/5ML syrup Take 74ml by mouth every 4 to 6 hours as needed for cough and congestion 200 mL 0  ? predniSONE (DELTASONE) 20 MG tablet Take 1 tablet by mouth twice a day for 4 days-- 1 tablet once daily for 4 days--  1/2 tablet once daily for 4 days-- 1/2 tablet by mouth every other day for 8 days 16 tablet 0  ? ?No facility-administered medications prior to visit.  ? ? ?PAST MEDICAL HISTORY: ?Past Medical History:  ?Diagnosis Date  ? AGUS favor dysplasia   ? Allergy   ? Anemia   ? Asthma   ? Diabetes mellitus without complication ([place])   ? Prediabetes   ? UTI (lower urinary tract infection)   ? ? ?PAST SURGICAL HISTORY: ?Past Surgical History:  ?Procedure Laterality Date  ? CESAREAN SECTION    ? TUBAL LIGATION    ? ? ?FAMILY HISTORY: ?Family History  ?Problem Relation Age of Onset  ? Diabetes Mother   ? Hyperlipidemia Mother   ? Stroke Mother   ? Diabetes Maternal Grandmother   ? Cancer Maternal Grandmother   ? Hypertension Maternal Grandmother   ? Hyperlipidemia Father   ? Hypertension Father   ? Stroke Father   ? Cancer Sister   ? Hypertension Sister   ? Hyperlipidemia Maternal Grandfather   ? Diabetes Maternal Grandfather   ? Stroke Maternal Grandfather   ? Cancer Paternal Grandmother    ? Diabetes Maternal Aunt   ? Diabetes Maternal Uncle   ? Cancer Other   ? Stroke Other   ? Hypertension Other   ? ? ?SOCIAL HISTORY: ?Social History  ? ?Socioeconomic History  ? Marital status: Married  ?  Spouse name: Not on file  ? Number of children: Not on file  ? Years of education: Not on file  ? Highest education level: Not on file  ?Occupational History  ? Not on file  ?Tobacco Use  ? Smoking status: Never  ? Smokeless tobacco:  Never  ?Substance and Sexual Activity  ? Alcohol use: No  ? Drug use: No  ? Sexual activity: Not on file  ?Other Topics Concern  ? Not on file  ?Social History Narrative  ? Not on file  ? ?Social Determinants of Health  ? ?Financial Resource Strain: Not on file  ?Food Insecurity: Not on file  ?Transportation Needs: Not on file  ?Physical Activity: Not on file  ?Stress: Not on file  ?Social Connections: Not on file  ?Intimate Partner Violence: Not on file  ? ? ?PHYSICAL EXAM ? ?GENERAL EXAM/CONSTITUTIONAL: ?Vitals:  ?Vitals:  ? 11/24/21 1457  ?BP: 111/70  ?Pulse: (!) 106  ?Weight: 151 lb (68.5 kg)  ?Height: 5\' 4"  (1.626 m)  ? ?Body mass index is 25.92 kg/m?. ?Wt Readings from Last 3 Encounters:  ?11/24/21 151 lb (68.5 kg)  ?08/27/21 156 lb 8 oz (71 kg)  ?05/26/21 164 lb (74.4 kg)  ? ?Patient is in no distress; well developed, nourished and groomed; neck is supple ? ?EYES: ?Pupils round and reactive to light, Visual fields full to confrontation, Extraocular movements intacts,  ? ?MUSCULOSKELETAL: ?Gait, strength, tone, movements noted in Neurologic exam below ? ?NEUROLOGIC: ?MENTAL STATUS:  ?   ? View : No data to display.  ?  ?  ?  ? ?awake, alert, oriented to person, place and time ?recent and remote memory intact ?normal attention and concentration ?language fluent, comprehension intact, naming intact ?fund of knowledge appropriate ? ?CRANIAL NERVE:  ?2nd, 3rd, 4th, 6th - pupils equal and reactive to light, visual fields full to confrontation, extraocular muscles intact, no  nystagmus ?5th - facial sensation symmetric ?7th - facial strength symmetric ?8th - hearing intact ?9th - palate elevates symmetrically, uvula midline ?11th - shoulder shrug symmetric ?12th - tongue protrusion

## 2021-12-19 ENCOUNTER — Other Ambulatory Visit (HOSPITAL_COMMUNITY): Payer: Self-pay

## 2021-12-29 ENCOUNTER — Other Ambulatory Visit (HOSPITAL_COMMUNITY): Payer: Self-pay

## 2021-12-31 DIAGNOSIS — R569 Unspecified convulsions: Secondary | ICD-10-CM | POA: Diagnosis not present

## 2021-12-31 DIAGNOSIS — R404 Transient alteration of awareness: Secondary | ICD-10-CM | POA: Diagnosis not present

## 2022-01-02 ENCOUNTER — Encounter: Payer: Self-pay | Admitting: Neurology

## 2022-01-02 ENCOUNTER — Other Ambulatory Visit (HOSPITAL_COMMUNITY): Payer: Self-pay

## 2022-01-12 ENCOUNTER — Other Ambulatory Visit (HOSPITAL_COMMUNITY): Payer: Self-pay

## 2022-01-17 ENCOUNTER — Encounter: Payer: Self-pay | Admitting: Neurology

## 2022-01-17 DIAGNOSIS — R569 Unspecified convulsions: Secondary | ICD-10-CM

## 2022-01-17 NOTE — Procedures (Signed)
   Clinical History : This is a 46 y/o F PMH seizures, prediabetes, asthma, abnormal PAP who presents with concern for amnesia.  INTERMITTENT MONITORING with VIDEO TECHNICAL SUMMARY: This AVEEG was performed using equipment provided by [employer] utilizing Bluetooth ( [employer] ) amplifiers with continuous EEGT attended video collection using encrypted remote transmission via [employer] secured cellular tower network with data rates for each AVEEG performed. This isa 19-channel digital AVEEG, obtained, according to the 10-20 international electrode placement system, reformatted digitally into referential and bipolar montages. Data was acquired with a minimum of 21 bipolar connections and sampled at a minimum rate of 250 cycles per second per channel, maximum rate of 450 cycles per second per channel and two channels for EKG. The entire VEEG study was recorded through cable and or radio telemetry for subsequent analysis. Specified epochs of the AVEEG data were identified at the direction of the subject by the depression of a push button by the patient.Each patients event file included data acquired two minutes prior to the push button activation and continuing until two minutes afterwards. AVEEG files reviewed on [employer] [facility] server, Licensed Software provided by [employer] with a digital high frequency filter set at 70 Hz and a low frequency filter set at 1 Hz with a paper speed of 34mm/s resulting in 10 seconds per digital page. This entire AVEEG was reviewed by the EEG Technologist. Random time samples, random sleep samples, clips, patient initiated push button files with included patient daily diary logs, EEG Technologist pruned data was reviewed and verified for accuracy and validity by the governing reading neurologist in full details. This AEEGV was fully compliant with all requirements for CPT 97500 for setup, patient education, take down and administered by an EEG  technologist.  Long-Term EEG with Video was monitored intermittently by a qualified EEG technologist for the entirety of the recording; quality check-ins were performed at a minimum of every two hours, checking and documenting real-time data and video to assure the integrity and quality of the recording (e.g., camera position, electrode integrity and impedance), and identify the need for maintenance. For intermittent monitoring, an EEG Technologist monitored no more than 12 patients concurrently. Diagnostic video was captured at least 80% of the time during the recording.  PATIENT EVENTS: There were no patient events noted or captured during this recording.  TECHNOLOGIST EVENTS: No clear epileptiform activity was detected by the reviewing neurodiagnostic technologist during the recording for further evaluation.  TIME SAMPLES: 10-minutes of every 2 hours recorded are reviewed as random time samples.  SLEEP SAMPLES: 5-minutes of every 24 hour recorded sleep cycle are reviewed as random sleep samples.  AWAKE: At maximal level of alertness, the posterior dominant background activity was continuous, reactive, low voltage rhythm of 11 Hz. This was symmetric, well-modulated, and attenuated with eye opening. Diffuse, symmetric, frontocentral beta range activity was present.  SLEEP: N1 Sleep (Stage 1) was observed and characterized by the disappearance of alpha rhythm and the appearance of vertex activity.  N2 Sleep (Stage 2) was observed and characterized by vertex waves, K-complexes, and sleep spindles.  N3 (Stage 3) sleep was observed and characterized by high amplitude Delta activity of 20%.  REM sleep was observed.  EKG: There were notes for possible PVC rhythm noted during this recording.   Impression: This is a normal 45 hours ambulatory video EEG. There were no events or seizures captured   Windell Norfolk, MD [facility]

## 2022-01-27 ENCOUNTER — Other Ambulatory Visit (HOSPITAL_COMMUNITY): Payer: Self-pay

## 2022-02-24 ENCOUNTER — Ambulatory Visit: Payer: 59 | Admitting: Neurology

## 2022-02-24 ENCOUNTER — Encounter: Payer: Self-pay | Admitting: Neurology

## 2022-02-24 VITALS — BP 111/73 | HR 101 | Ht 64.0 in | Wt 146.0 lb

## 2022-02-24 DIAGNOSIS — Z5181 Encounter for therapeutic drug level monitoring: Secondary | ICD-10-CM

## 2022-02-24 DIAGNOSIS — R569 Unspecified convulsions: Secondary | ICD-10-CM | POA: Diagnosis not present

## 2022-02-24 NOTE — Progress Notes (Signed)
[facility]  PATIENT: Deborah Chavez DOB: 15-May-1976  REFERRING CLINICIAN: Renaye Rakers, MD HISTORY FROM: Patient  REASON FOR VISIT: Episode of amnesia.    HISTORICAL  CHIEF COMPLAINT:  Chief Complaint  Patient presents with   Follow-up    Rm 12, alone, Reports no changes, here to discuss EEG RESULTS    INTERVAL HISTORY 02/24/2022:  Patient presents today for follow up. At last visit, plan was to increase Keppra to 1000 mg BID and to obtain an ambulatory EEG. EEG completed, she had one event during the first night, when she felt a jolt like episode followed by confusion. On EEG, there were no changes to the background. She was able to press the button but was unable to write down, she attempted but couldn't but again, EEG with no seizure.  Since being on Keppra 1000 mg BID, she denies any further episodes and denies side effects from the medications.    INTERVAL HISTORY 11/24/2021:  Patient presents today for follow-up, last visit was February 8.  At that time she was reporting 3 events per month.  I started her on levetiracetam 500 mg twice daily and since then she is reporting 2 events per month.  There are instances where she will forget to take her medication but she is not sure if those event was related to the fact that she is forgetting the medication.  There was one event when she reported waking up with tongue biting and muscle soreness.  She denies any side effect from the Keppra.   INTERVAL HISTORY 08/27/2021:  Patient presents today for follow-up, at last visit plan was to obtain a routine EEG which was normal, did not show any epileptiform discharge.  She continues to experience the episodes, up to 3 times per month.  On December 15 she reported left-sided headache with onset with nausea and blurry vision, on December 25 she reported memory loss in the evening she felt extremely wiped out and did not remember the day's events. Her last episode was January 25  she remembered being at work then noted that she had to shred some paper but could not find a paper to shred. Then after finding a coworker to help her, it was noted that patient already shared the paper, she does not know how and when she walked to the shredder and shred the paper.  Again denies any frank seizure or seizure-like activity.     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  She said in the beginning of summer she woke up thinking it was a Saturday when in fact it was Sunday, she cannot really recall anything that happened on that Saturday other than what was on her text messages but she cannot even remember sending them.  She said she checked her blood glucose monitor and it was in her 79s therefore she was sent to see endocrine. She does not currently have a diagnosis of diabetes but she is prediabetic and based on a blood glucose monitor she has not been on extended period of hypoglycemia.  Since that she report episode of gap in memory that she cannot remember, or recall, she followed up with endocrine again and was referred here to rule out seizures since he had a history of seizure as a child.  She also reported that 2 of her kids have  seizure and the they are both on medication.  She denies waking up with blood in the pillow, denies waking up on the floor, denies any additional seizures since the first seizure that she had as a child, at that time she states she was not treated with medication.   Denies any history of stroke or head trauma.    OTHER MEDICAL CONDITIONS: Seizure, Prediabetes, Asthma, abnormal PAP   REVIEW OF SYSTEMS: Full 14 system review of systems performed and negative with exception of: Seizure   ALLERGIES: Allergies  Allergen Reactions   Aspirin    Lactose     Other reaction(s): GI Upset (intolerance)   Lactose  Intolerance (Gi)    Mobic [Meloxicam] Other (See Comments)    Causes extreme fatigue and sleepiness   Tramadol Hcl     Other reaction(s): Other (See Comments) EKG changes   Ultram [Tramadol Hcl]    Vioxx [Rofecoxib]    Other Rash    TB test causes rashes and streaks    Sulfa Antibiotics Rash    HOME MEDICATIONS: Outpatient Medications Prior to Visit  Medication Sig Dispense Refill   albuterol (PROVENTIL HFA;VENTOLIN HFA) 108 (90 BASE) MCG/ACT inhaler Inhale 2 puffs into the lungs every 6 (six) hours as needed.     Continuous Blood Gluc Receiver (FREESTYLE LIBRE 2 READER) DEVI Scan as needed for continuous glucose monitoring. 1 each 0   Continuous Blood Gluc Sensor (FREESTYLE LIBRE 2 SENSOR) MISC APPLY ONE SENSOR TO CLEAN NONDOMINATE ARM EVERY 14 DAYS 2 each 3   Continuous Blood Gluc Sensor (FREESTYLE LIBRE 2 SENSOR) MISC Apply 1 sensor to clean nondominate arm every 14 days 6 each 2   levETIRAcetam (KEPPRA) 1000 MG tablet Take 1 tablet (1,000 mg total) by mouth 2 (two) times daily. 60 tablet 11   montelukast (SINGULAIR) 10 MG tablet Take 10 mg by mouth at bedtime.     Semaglutide,0.25 or 0.5MG /DOS, (OZEMPIC, 0.25 OR 0.5 MG/DOSE,) 2 MG/3ML SOPN Inject 0.5 mg into the skin once a week. 9 mL 1   No facility-administered medications prior to visit.    PAST MEDICAL HISTORY: Past Medical History:  Diagnosis Date   AGUS favor dysplasia    Allergy    Anemia    Asthma    Diabetes mellitus without complication (HCC)    Prediabetes    UTI (lower urinary tract infection)     PAST SURGICAL HISTORY: Past Surgical History:  Procedure Laterality Date   CESAREAN SECTION     TUBAL LIGATION      FAMILY HISTORY: Family History  Problem Relation Age of Onset   Diabetes Mother    Hyperlipidemia Mother    Stroke Mother    Diabetes Maternal Grandmother    Cancer Maternal Grandmother    Hypertension Maternal Grandmother    Hyperlipidemia Father    Hypertension Father    Stroke Father     Cancer Sister    Hypertension Sister    Hyperlipidemia Maternal Grandfather    Diabetes Maternal Grandfather    Stroke Maternal Grandfather    Cancer Paternal Grandmother    Diabetes Maternal Aunt    Diabetes Maternal Uncle    Cancer Other    Stroke Other    Hypertension Other     SOCIAL HISTORY: Social History   Socioeconomic History   Marital status: Married    Spouse name: Not on file   Number of children: Not on file   Years of education: Not on file   Highest education  level: Not on file  Occupational History   Not on file  Tobacco Use   Smoking status: Never   Smokeless tobacco: Never  Substance and Sexual Activity   Alcohol use: No   Drug use: No   Sexual activity: Not on file  Other Topics Concern   Not on file  Social History Narrative   Not on file   Social Determinants of Health   Financial Resource Strain: Not on file  Food Insecurity: Not on file  Transportation Needs: Not on file  Physical Activity: Not on file  Stress: Not on file  Social Connections: Not on file  Intimate Partner Violence: Not on file    PHYSICAL EXAM  GENERAL EXAM/CONSTITUTIONAL: Vitals:  Vitals:   02/24/22 1121  BP: 111/73  Pulse: (!) 101  Weight: 146 lb (66.2 kg)  Height: 5\' 4"  (1.626 m)   Body mass index is 25.06 kg/m. Wt Readings from Last 3 Encounters:  02/24/22 146 lb (66.2 kg)  11/24/21 151 lb (68.5 kg)  08/27/21 156 lb 8 oz (71 kg)   Patient is in no distress; well developed, nourished and groomed; neck is supple  EYES: Pupils round and reactive to light, Visual fields full to confrontation, Extraocular movements intacts,   MUSCULOSKELETAL: Gait, strength, tone, movements noted in Neurologic exam below  NEUROLOGIC: MENTAL STATUS:      No data to display         awake, alert, oriented to person, place and time recent and remote memory intact normal attention and concentration language fluent, comprehension intact, naming intact fund of  knowledge appropriate  CRANIAL NERVE:  2nd, 3rd, 4th, 6th - pupils equal and reactive to light, visual fields full to confrontation, extraocular muscles intact, no nystagmus 5th - facial sensation symmetric 7th - facial strength symmetric 8th - hearing intact 9th - palate elevates symmetrically, uvula midline 11th - shoulder shrug symmetric 12th - tongue protrusion midline  MOTOR:  normal bulk and tone, full strength in the BUE, BLE  SENSORY:  normal and symmetric to light touch, pinprick, temperature, vibration  COORDINATION:  finger-nose-finger, fine finger movements normal  REFLEXES:  deep tendon reflexes present and symmetric  GAIT/STATION:  normal   DIAGNOSTIC DATA (LABS, IMAGING, TESTING) - I reviewed patient records, labs, notes, testing and imaging myself where available.  Lab Results  Component Value Date   WBC 9.6 05/27/2020   HGB 14.4 05/27/2020   HCT 42.6 05/27/2020   MCV 91.6 05/27/2020   PLT 264 05/27/2020      Component Value Date/Time   NA 136 05/27/2020 1624   K 4.2 05/27/2020 1624   CL 101 05/27/2020 1624   CO2 22 05/27/2020 1624   GLUCOSE 94 05/27/2020 1624   BUN 13 05/27/2020 1624   CREATININE 0.80 05/27/2020 1624   CALCIUM 9.3 05/27/2020 1624   PROT 7.9 05/27/2020 1624   ALBUMIN 4.3 05/27/2020 1624   AST 22 05/27/2020 1624   ALT 12 05/27/2020 1624   ALKPHOS 65 05/27/2020 1624   BILITOT 0.6 05/27/2020 1624   GFRNONAA >60 05/27/2020 1624   No results found for: "CHOL", "HDL", "LDLCALC", "LDLDIRECT", "TRIG", "CHOLHDL" Lab Results  Component Value Date   HGBA1C 6.3 05/27/2016   No results found for: "VITAMINB12" No results found for: "TSH"   MRI Brain with and without contrast 09/2020 Normal examination   Routine EEG 06/03/2021:  This is a normal EEG recording in the waking and sleeping state. No evidence interictal epileptiform discharges were seen at any  time during the recording.  A normal EEG does not exclude a diagnosis of  epilepsy.   Ambulatory  EEG 01/17/2022 This is a normal 45 hours ambulatory video EEG, no seizures captured  ASSESSMENT AND PLAN  46 y.o. year old female with past medical history of seizure as a child, abnormal Pap and prediabetes who is presenting for follow up for period of amnesia. Her ambulatory EEG was negative for any abnormal discharges and no seizures. Since being on Keppra 1000 mg twice daily, she reports control of her symptoms. We will check a Keppra level and continue patient on Keppra 1000 mg BID. Advise her to contact us if he has a breakthrough event, at that time, will refer her to [facility] for capture and characterization. She voices understanding. Follow up in 6 months or sooner if worse .    1. Seizure-like activity (HCC)   2. Therapeutic drug monitoring     Patient Instructions  Continue with Keppra 1000 mg twice daily  Will check a Keppra level today  Contact us if you have further episode, at that time, will consider referral to EMU for capture and characterization.  Follow up in 6 months or sooner if worse    Orders Placed This Encounter  Procedures   Levetiracetam level    No orders of the defined types were placed in this encounter.    Return in about 6 months (around 08/27/2022).    Windell Norfolk, MD 02/24/2022, 12:53 PM  [facility] [address], [address] [place], [place] [address] [phone]

## 2022-02-24 NOTE — Patient Instructions (Signed)
Continue with Keppra 1000 mg twice daily  Will check a Keppra level today  Contact us if you have further episode, at that time, will consider referral to EMU for capture and characterization.  Follow up in 6 months or sooner if worse

## 2022-02-25 LAB — LEVETIRACETAM LEVEL: Levetiracetam Lvl: 22.3 ug/mL (ref 10.0–40.0)

## 2022-02-26 ENCOUNTER — Other Ambulatory Visit (HOSPITAL_COMMUNITY): Payer: Self-pay

## 2022-03-03 ENCOUNTER — Other Ambulatory Visit (HOSPITAL_COMMUNITY): Payer: Self-pay

## 2022-03-03 MED ORDER — FREESTYLE LIBRE 2 SENSOR MISC
2 refills | Status: AC
  Filled 2022-03-03: qty 2, 28d supply, fill #0
  Filled 2022-04-03: qty 2, 28d supply, fill #1
  Filled 2022-04-26: qty 2, 28d supply, fill #2
  Filled 2022-05-24: qty 2, 28d supply, fill #3
  Filled 2022-07-06: qty 2, 28d supply, fill #4
  Filled 2022-07-30: qty 2, 28d supply, fill #5
  Filled 2022-09-02: qty 2, 28d supply, fill #6
  Filled 2022-09-27: qty 2, 28d supply, fill #7
  Filled 2022-11-09: qty 2, 28d supply, fill #8

## 2022-03-04 ENCOUNTER — Other Ambulatory Visit (HOSPITAL_COMMUNITY): Payer: Self-pay

## 2022-03-25 ENCOUNTER — Other Ambulatory Visit (HOSPITAL_COMMUNITY): Payer: Self-pay

## 2022-04-03 ENCOUNTER — Other Ambulatory Visit (HOSPITAL_COMMUNITY): Payer: Self-pay

## 2022-04-27 ENCOUNTER — Other Ambulatory Visit (HOSPITAL_COMMUNITY): Payer: Self-pay

## 2022-05-12 ENCOUNTER — Other Ambulatory Visit: Payer: Self-pay

## 2022-05-12 ENCOUNTER — Emergency Department (HOSPITAL_BASED_OUTPATIENT_CLINIC_OR_DEPARTMENT_OTHER): Payer: 59

## 2022-05-12 ENCOUNTER — Emergency Department (HOSPITAL_BASED_OUTPATIENT_CLINIC_OR_DEPARTMENT_OTHER)
Admission: EM | Admit: 2022-05-12 | Discharge: 2022-05-12 | Disposition: A | Discharge status: Home / Self Care | Payer: 59 | Attending: Emergency Medicine | Admitting: Emergency Medicine

## 2022-05-12 ENCOUNTER — Encounter: Payer: Self-pay | Admitting: Neurology

## 2022-05-12 DIAGNOSIS — R8289 Other abnormal findings on cytological and histological examination of urine: Secondary | ICD-10-CM | POA: Diagnosis not present

## 2022-05-12 DIAGNOSIS — H53149 Visual discomfort, unspecified: Secondary | ICD-10-CM | POA: Diagnosis not present

## 2022-05-12 DIAGNOSIS — R519 Headache, unspecified: Secondary | ICD-10-CM | POA: Diagnosis present

## 2022-05-12 DIAGNOSIS — R7309 Other abnormal glucose: Secondary | ICD-10-CM | POA: Diagnosis not present

## 2022-05-12 LAB — URINALYSIS, ROUTINE W REFLEX MICROSCOPIC
Bilirubin Urine: NEGATIVE
Bilirubin Urine: NEGATIVE
Glucose, UA: NEGATIVE mg/dL
Glucose, UA: NEGATIVE mg/dL
Hgb urine dipstick: NEGATIVE
Hgb urine dipstick: NEGATIVE
Ketones, ur: NEGATIVE mg/dL
Ketones, ur: NEGATIVE mg/dL
Leukocytes,Ua: NEGATIVE
Leukocytes,Ua: NEGATIVE
Nitrite: POSITIVE — AB
Nitrite: POSITIVE — AB
Protein, ur: NEGATIVE mg/dL
Protein, ur: NEGATIVE mg/dL
Specific Gravity, Urine: 1.017 (ref 1.005–1.030)
Specific Gravity, Urine: 1.017 (ref 1.005–1.030)
pH: 7.5 (ref 5.0–8.0)
pH: 7.5 (ref 5.0–8.0)

## 2022-05-12 LAB — CBC
HCT: 37.6 % (ref 36.0–46.0)
Hemoglobin: 12.3 g/dL (ref 12.0–15.0)
MCH: 29.9 pg (ref 26.0–34.0)
MCHC: 32.7 g/dL (ref 30.0–36.0)
MCV: 91.5 fL (ref 80.0–100.0)
Platelets: 329 10*3/uL (ref 150–400)
RBC: 4.11 MIL/uL (ref 3.87–5.11)
RDW: 13.4 % (ref 11.5–15.5)
WBC: 6.2 10*3/uL (ref 4.0–10.5)
nRBC: 0 % (ref 0.0–0.2)

## 2022-05-12 LAB — BASIC METABOLIC PANEL
Anion gap: 7 (ref 5–15)
BUN: 13 mg/dL (ref 6–20)
CO2: 27 mmol/L (ref 22–32)
Calcium: 9.9 mg/dL (ref 8.9–10.3)
Chloride: 102 mmol/L (ref 98–111)
Creatinine, Ser: 0.93 mg/dL (ref 0.44–1.00)
GFR, Estimated: >60 mL/min (ref >60)
Glucose, Bld: 95 mg/dL (ref 70–99)
Potassium: 4.3 mmol/L (ref 3.5–5.1)
Sodium: 136 mmol/L (ref 135–145)

## 2022-05-12 LAB — CBG MONITORING, ED: Glucose-Capillary: 109 mg/dL — ABNORMAL HIGH (ref 70–99)

## 2022-05-12 LAB — PREGNANCY, URINE: Preg Test, Ur: NEGATIVE

## 2022-05-12 MED ORDER — SODIUM CHLORIDE 0.9 % IV BOLUS
1000.0000 mL | Freq: Once | INTRAVENOUS | Status: AC
  Administered 2022-05-12: 1000 mL via INTRAVENOUS

## 2022-05-12 MED ORDER — ACETAMINOPHEN 500 MG PO TABS
1000.0000 mg | ORAL_TABLET | Freq: Once | ORAL | Status: AC
  Administered 2022-05-12: 1000 mg via ORAL
  Filled 2022-05-12: qty 2

## 2022-05-12 NOTE — ED Notes (Signed)
Discharge paperwork given and verbally understood. 

## 2022-05-12 NOTE — ED Provider Notes (Signed)
[facility] EMERGENC[facility] DEPT Provider Note   CSN: [id number] Arrival date & time: 05/12/22  1052     History  No chief complaint on file.   Deborah Chavez is a 46 y.o. female with a PMHx of seizures who presents to the ED with concerns for headache onset this morning.  Notes that she felt groggy this morning as well.  Notes that she missed 1 dose of her Keppra last week.  Has a history of migraines however notes with her typical migraine she has vision changes as well as nausea which she does not have today.  Notes that she took a dose of Motrin at 7:30 AM this morning.  Has associated photophobia.  Denies nausea, vomiting, chest pain, shortness of breath, urinary symptoms, numbness, tingling, weakness, blurred vision, diplopia.  Was recently evaluated by her neurologist and has a follow-up appointment in February 2024.   The history is provided by the patient. No language interpreter was used.       Home Medications Prior to Admission medications   Medication Sig Start Date End Date Taking? Authorizing Provider  albuterol (PROVENTIL HFA;VENTOLIN HFA) 108 (90 BASE) MCG/ACT inhaler Inhale 2 puffs into the lungs every 6 (six) hours as needed.    [provider]  Continuous Blood Gluc Receiver (FREESTYLE LIBRE 2 READER) DEVI Scan as needed for continuous glucose monitoring. 02/14/21     Continuous Blood Gluc Sensor (FREESTYLE LIBRE 2 SENSOR) MISC APPLY ONE SENSOR TO CLEAN NONDOMINATE ARM EVERY 14 DAYS 09/25/20   Lucianne Lei, MD  Continuous Blood Gluc Sensor (FREESTYLE LIBRE 2 SENSOR) MISC Apply 1 sensor to clean nondominate arm every 14 days 03/03/22     levETIRAcetam (KEPPRA) 1000 MG tablet Take 1 tablet (1,000 mg total) by mouth 2 (two) times daily. 11/24/21   Alric Ran, MD  montelukast (SINGULAIR) 10 MG tablet Take 10 mg by mouth at bedtime.    [provider]  Semaglutide,0.25 or 0.5MG /DOS, (OZEMPIC, 0.25 OR 0.5 MG/DOSE,) 2 MG/3ML SOPN Inject 0.5 mg into  the skin once a week. 11/13/21     Norethindrone Acetate-Ethinyl Estrad-FE (BLISOVI 24 FE) 1-20 MG-MCG(24) tablet Take 1 tablet by mouth daily. 12/11/20 02/01/21        Allergies    Aspirin, Lactose, Lactose intolerance (gi), Mobic [meloxicam], Tramadol hcl, Ultram [tramadol hcl], Vioxx [rofecoxib], Other, and Sulfa antibiotics    Review of Systems   Review of Systems  All other systems reviewed and are negative.   Physical Exam Updated Vital Signs BP 133/79 (BP Location: Right Arm)   Pulse 99   Temp 98.8 F (37.1 C)   Resp 18   Ht 5\' 4"  (1.626 m)   Wt 69.4 kg   SpO2 100%   BMI 26.26 kg/m  Physical Exam Vitals and nursing note reviewed.  Constitutional:      General: She is not in acute distress.    Appearance: She is not diaphoretic.  HENT:     Head: Normocephalic and atraumatic.     Mouth/Throat:     Pharynx: No oropharyngeal exudate.  Eyes:     General: No visual field deficit or scleral icterus.    Conjunctiva/sclera: Conjunctivae normal.  Cardiovascular:     Rate and Rhythm: Normal rate and regular rhythm.     Pulses: Normal pulses.     Heart sounds: Normal heart sounds.  Pulmonary:     Effort: Pulmonary effort is normal. No respiratory distress.     Breath sounds: Normal breath sounds. No  wheezing.  Abdominal:     General: Bowel sounds are normal.     Palpations: Abdomen is soft. There is no mass.     Tenderness: There is no abdominal tenderness. There is no guarding or rebound.  Musculoskeletal:        General: Normal range of motion.     Cervical back: Normal range of motion and neck supple.  Skin:    General: Skin is warm and dry.  Neurological:     General: No focal deficit present.     Mental Status: She is alert.     Cranial Nerves: Cranial nerves 2-12 are intact.     Sensory: Sensation is intact. No sensory deficit.     Motor: Motor function is intact. No pronator drift.     Coordination: Coordination is intact. Finger-Nose-Finger Test and Heel to  [place] Test normal.     Gait: Gait is intact. Gait normal.     Comments: No focal neurological deficits.  Cranial nerves II through XII intact.  Negative pronator drift.  Strength sensation intact bilateral upper and lower extremities.  Grip strength 5/5 bilaterally.  Normal finger-nose testing, normal heel-to-shin testing.  No visual fields deficits.  Psychiatric:        Behavior: Behavior normal.     ED Results / Procedures / Treatments   Labs (all labs ordered are listed, but only abnormal results are displayed) Labs Reviewed  URINALYSIS, ROUTINE W REFLEX MICROSCOPIC - Abnormal; Notable for the following components:      Result Value   APPearance HAZY (*)    Nitrite POSITIVE (*)    Crystals PRESENT (*)    All other components within normal limits  URINALYSIS, ROUTINE W REFLEX MICROSCOPIC - Abnormal; Notable for the following components:   APPearance HAZY (*)    Nitrite POSITIVE (*)    Bacteria, UA RARE (*)    All other components within normal limits  CBG MONITORING, ED - Abnormal; Notable for the following components:   Glucose-Capillary 109 (*)    All other components within normal limits  BASIC METABOLIC PANEL  CBC  PREGNANCY, URINE    EKG EKG Interpretation  Date/Time:  Tuesday May 12 2022 12:09:40 EDT Ventricular Rate:  82 PR Interval:  149 QRS Duration: 80 QT Interval:  363 QTC Calculation: 424 R Axis:   47 Text Interpretation: Sinus rhythm No significant change since last tracing Confirmed by Deno Etienne [phone]) on 05/12/2022 2:45:12 PM  Radiology CT Head Wo Contrast  Result Date: 05/12/2022 CLINICAL DATA:  Chronic headache.  Possible seizure EXAM: CT HEAD WITHOUT CONTRAST TECHNIQUE: Contiguous axial images were obtained from the base of the skull through the vertex without intravenous contrast. RADIATION DOSE REDUCTION: This exam was performed according to the departmental dose-optimization program which includes automated exposure control, adjustment of the  mA and/or kV according to patient size and/or use of iterative reconstruction technique. COMPARISON:  MRI head 10/05/2020 FINDINGS: Brain: No evidence of acute infarction, hemorrhage, hydrocephalus, extra-axial collection or mass lesion/mass effect. Mild enlargement of the sella filled with CSF.  Small pituitary. Vascular: Negative for hyperdense vessel Skull: Negative Sinuses/Orbits: Negative Other: None IMPRESSION: 1. No acute intracranial abnormality. 2. Mild enlargement of the sella filled with CSF. This could be a normal variant but can be seen with idiopathic intracranial hypertension. No change from prior MRI. Electronically Signed   By: Franchot Gallo M.D.   On: 05/12/2022 13:04    Procedures Procedures    Medications Ordered in ED Medications  sodium chloride 0.9 % bolus 1,000 mL (1,000 mLs Intravenous New Bag/Given 05/12/22 1245)  acetaminophen (TYLENOL) tablet 1,000 mg (1,000 mg Oral Given 05/12/22 1303)    ED Course/ Medical Decision Making/ A&P Clinical Course as of 05/12/22 1548  Tue May 12, 2022  1355 Reevaluated and noted improvement of headache with treatment regimen in the ED. [SB]  1449 Discussed lab and imaging findings with patient at bedside. Discussed with patient importance of following up with neurologist regarding todays ED visit. Answered all available questions. Pt appears safe for discharge.  [SB]    Clinical Course User Index [SB] Mrytle Bento A, PA-C                           Medical Decision Making Amount and/or Complexity of Data Reviewed Labs: ordered. Radiology: ordered.  Risk OTC drugs.   Pt presented to the ED with concerns for headache onset this morning.  Notes that she fell gradients morning as well.  Missed 1 dose of her Keppra last week that she takes for seizures.  Recently had a follow-up with her neurologist with no acute concerning findings at that time.  Has associated photophobia.  Patient afebrile.  On exam patient with no focal  neurological deficits on exam.  No visual field changes.  Differential diagnosis includes SAH, ICH, migraine headache, tension headache, breakthrough seizure.   Labs:  I ordered, and personally interpreted labs.  The pertinent results include:   Urinalysis notable for positive for nitrates along with squamous cells noted (patient asymptomatic at this time). CBG with glucose at 109. Negative pregnancy urine. CBC and BMP unremarkable  Imaging: I ordered imaging studies including CT head wo  I independently visualized and interpreted imaging which showed:  1. No acute intracranial abnormality.  2. Mild enlargement of the sella filled with CSF. This could be a  normal variant but can be seen with idiopathic intracranial  hypertension. No change from prior MRI.   I agree with the radiologist interpretation  Medications:  I ordered medication including IVF, tylenol for headache management  Reevaluation of the patient after these medicines and interventions, I reevaluated the patient and found that they have improved I have reviewed the patients home medicines and have made adjustments as needed   Disposition: Presentation suspicious for likely migraine headache.  Also suspicious for likely breakthrough seizure due to missed dose of Keppra last week. Presentation less likely due to [facility] or ICH due to the absence of red flags.  Doubt CVA or TIA at this time.  After consideration of the diagnostic results and the patients response to treatment, I feel that the patient would benefit from Discharge home.  Patient to follow-up with her neurologist regarding today's ED visit.  Supportive care measures and strict return precautions discussed with patient.  Patient knowledges and verbalized understanding.  Patient agreeable to discharge treatment plan. Patient appears safe for discharge at this time.  Follow-up as indicated in the discharge paperwork.   This chart was dictated using voice recognition  software, Dragon. Despite the best efforts of this provider to proofread and correct errors, errors may still occur which can change documentation meaning.   Final Clinical Impression(s) / ED Diagnoses Final diagnoses:  Acute nonintractable headache, unspecified headache type    Rx / DC Orders ED Discharge Orders     None         Zoriah Pulice A, PA-C 05/12/22 1548    Deno Etienne, DO  05/13/22 0655  

## 2022-05-12 NOTE — ED Triage Notes (Signed)
Pt arrived POV. Pt caox4 and ambulatory. Pt states "I think I had a seizure last night." Pt reports approx 2044 she experienced a seizure aura then some time after that "felt jolt while laying in bed" and does not remember anything else last night. Pt woke up in bed this morning c/o headache and feeling groggy.   HX seizure, takes keppra, missed a recent dose, motrin 0730 am with no relief.

## 2022-05-12 NOTE — Discharge Instructions (Signed)
Is a pleasure to give you today!  Your labs did not show any concerning findings today.  Your CT scan of your head did not show any concerning findings that were different from your MRI previously.  Ensure to maintain fluid intake.  It is important that you call your neurologist today to set up a follow-up appointment regarding today's ED visit.  Return to the emergency department for experiencing increasing/worsening symptoms.

## 2022-05-13 ENCOUNTER — Other Ambulatory Visit (HOSPITAL_COMMUNITY): Payer: Self-pay

## 2022-05-13 ENCOUNTER — Other Ambulatory Visit: Payer: Self-pay | Admitting: Neurology

## 2022-05-13 ENCOUNTER — Other Ambulatory Visit (HOSPITAL_BASED_OUTPATIENT_CLINIC_OR_DEPARTMENT_OTHER): Payer: Self-pay

## 2022-05-13 DIAGNOSIS — R569 Unspecified convulsions: Secondary | ICD-10-CM

## 2022-05-13 MED ORDER — SUMATRIPTAN SUCCINATE 50 MG PO TABS
50.0000 mg | ORAL_TABLET | ORAL | 0 refills | Status: DC | PRN
  Filled 2022-05-13: qty 10, 30d supply, fill #0

## 2022-05-13 NOTE — Telephone Encounter (Signed)
I called pt.  She says that she still has a headache today (does have hx of migraines).  Usually motrin helps.  Her seizure that she has Monday night, she feels like was overstimulation (over worked, stressed, not eating well,low blood sugar).  She was alone when she had the seizure.  She missed her keppra dose a week prior to seizure.  Has appt in February 20-2023.  I did not see an earlier appt available.  Please advise.  She takes keppra 1000mg  po bid.

## 2022-05-14 ENCOUNTER — Other Ambulatory Visit (HOSPITAL_COMMUNITY): Payer: Self-pay

## 2022-05-14 ENCOUNTER — Telehealth: Payer: Self-pay | Admitting: Neurology

## 2022-05-14 NOTE — Telephone Encounter (Signed)
Referral for neurology fax to [facility] to see Dr. Zeb Comfort. Phone: [phone], Fax: [phone].

## 2022-05-24 ENCOUNTER — Other Ambulatory Visit (HOSPITAL_COMMUNITY): Payer: Self-pay

## 2022-05-25 ENCOUNTER — Other Ambulatory Visit (HOSPITAL_COMMUNITY): Payer: Self-pay

## 2022-05-25 MED ORDER — SEMAGLUTIDE(0.25 OR 0.5MG/DOS) 2 MG/3ML ~~LOC~~ SOPN
0.5000 mg | PEN_INJECTOR | SUBCUTANEOUS | 1 refills | Status: DC
  Filled 2022-05-25: qty 3, 28d supply, fill #0
  Filled 2022-07-06: qty 3, 28d supply, fill #1
  Filled 2022-07-30: qty 3, 28d supply, fill #2
  Filled 2022-09-02: qty 3, 28d supply, fill #3
  Filled 2022-09-27: qty 3, 28d supply, fill #4
  Filled 2022-11-09: qty 3, 28d supply, fill #5

## 2022-06-08 ENCOUNTER — Telehealth: Payer: Self-pay | Admitting: *Deleted

## 2022-06-08 NOTE — Telephone Encounter (Signed)
R/c pt fmla form

## 2022-06-16 NOTE — Telephone Encounter (Signed)
FMLA request completed and placed on MD's desk for review and signature if appropriate.

## 2022-06-16 NOTE — Telephone Encounter (Signed)
Form signed.

## 2022-06-17 NOTE — Telephone Encounter (Signed)
Forms given to medical records for processing.

## 2022-07-28 ENCOUNTER — Emergency Department (HOSPITAL_BASED_OUTPATIENT_CLINIC_OR_DEPARTMENT_OTHER): Payer: 59

## 2022-07-28 ENCOUNTER — Other Ambulatory Visit: Payer: Self-pay

## 2022-07-28 ENCOUNTER — Encounter (HOSPITAL_BASED_OUTPATIENT_CLINIC_OR_DEPARTMENT_OTHER): Payer: Self-pay

## 2022-07-28 ENCOUNTER — Emergency Department (HOSPITAL_BASED_OUTPATIENT_CLINIC_OR_DEPARTMENT_OTHER)
Admission: EM | Admit: 2022-07-28 | Discharge: 2022-07-28 | Disposition: A | Discharge status: Home / Self Care | Payer: 59 | Attending: Emergency Medicine | Admitting: Emergency Medicine

## 2022-07-28 DIAGNOSIS — R1031 Right lower quadrant pain: Secondary | ICD-10-CM | POA: Diagnosis present

## 2022-07-28 DIAGNOSIS — N83201 Unspecified ovarian cyst, right side: Secondary | ICD-10-CM | POA: Diagnosis not present

## 2022-07-28 DIAGNOSIS — E119 Type 2 diabetes mellitus without complications: Secondary | ICD-10-CM | POA: Diagnosis not present

## 2022-07-28 DIAGNOSIS — J45909 Unspecified asthma, uncomplicated: Secondary | ICD-10-CM | POA: Insufficient documentation

## 2022-07-28 LAB — CBC WITH DIFFERENTIAL/PLATELET
Abs Immature Granulocytes: 0.09 10*3/uL — ABNORMAL HIGH (ref 0.00–0.07)
Basophils Absolute: 0.1 10*3/uL (ref 0.0–0.1)
Basophils Relative: 1 %
Eosinophils Absolute: 0.3 10*3/uL (ref 0.0–0.5)
Eosinophils Relative: 3 %
HCT: 36.2 % (ref 36.0–46.0)
Hemoglobin: 11.9 g/dL — ABNORMAL LOW (ref 12.0–15.0)
Immature Granulocytes: 1 %
Lymphocytes Relative: 31 %
Lymphs Abs: 3.1 10*3/uL (ref 0.7–4.0)
MCH: 29.5 pg (ref 26.0–34.0)
MCHC: 32.9 g/dL (ref 30.0–36.0)
MCV: 89.6 fL (ref 80.0–100.0)
Monocytes Absolute: 0.9 10*3/uL (ref 0.1–1.0)
Monocytes Relative: 9 %
Neutro Abs: 5.7 10*3/uL (ref 1.7–7.7)
Neutrophils Relative %: 55 %
Platelets: 322 10*3/uL (ref 150–400)
RBC: 4.04 MIL/uL (ref 3.87–5.11)
RDW: 13.4 % (ref 11.5–15.5)
WBC: 10.2 10*3/uL (ref 4.0–10.5)
nRBC: 0 % (ref 0.0–0.2)

## 2022-07-28 LAB — COMPREHENSIVE METABOLIC PANEL
ALT: 13 U/L (ref 0–44)
AST: 19 U/L (ref 15–41)
Albumin: 4.1 g/dL (ref 3.5–5.0)
Alkaline Phosphatase: 69 U/L (ref 38–126)
Anion gap: 12 (ref 5–15)
BUN: 15 mg/dL (ref 6–20)
CO2: 22 mmol/L (ref 22–32)
Calcium: 9.7 mg/dL (ref 8.9–10.3)
Chloride: 97 mmol/L — ABNORMAL LOW (ref 98–111)
Creatinine, Ser: 0.79 mg/dL (ref 0.44–1.00)
GFR, Estimated: >60 mL/min (ref >60)
Glucose, Bld: 122 mg/dL — ABNORMAL HIGH (ref 70–99)
Potassium: 4 mmol/L (ref 3.5–5.1)
Sodium: 131 mmol/L — ABNORMAL LOW (ref 135–145)
Total Bilirubin: 0.3 mg/dL (ref 0.3–1.2)
Total Protein: 7.7 g/dL (ref 6.5–8.1)

## 2022-07-28 LAB — LIPASE, BLOOD: Lipase: 31 U/L (ref 11–51)

## 2022-07-28 LAB — URINALYSIS, MICROSCOPIC (REFLEX): RBC / HPF: NONE SEEN RBC/hpf (ref 0–5)

## 2022-07-28 LAB — URINALYSIS, ROUTINE W REFLEX MICROSCOPIC
Bilirubin Urine: NEGATIVE
Glucose, UA: NEGATIVE mg/dL
Hgb urine dipstick: NEGATIVE
Ketones, ur: NEGATIVE mg/dL
Leukocytes,Ua: NEGATIVE
Nitrite: NEGATIVE
Protein, ur: 30 mg/dL — AB
Specific Gravity, Urine: 1.02 (ref 1.005–1.030)
pH: 7.5 (ref 5.0–8.0)

## 2022-07-28 LAB — PREGNANCY, URINE: Preg Test, Ur: NEGATIVE

## 2022-07-28 MED ORDER — ACETAMINOPHEN 500 MG PO TABS
1000.0000 mg | ORAL_TABLET | ORAL | Status: AC
  Administered 2022-07-28: 1000 mg via ORAL
  Filled 2022-07-28: qty 2

## 2022-07-28 MED ORDER — KETOROLAC TROMETHAMINE 15 MG/ML IJ SOLN
15.0000 mg | Freq: Once | INTRAMUSCULAR | Status: AC
  Administered 2022-07-28: 15 mg via INTRAVENOUS
  Filled 2022-07-28: qty 1

## 2022-07-28 MED ORDER — OXYCODONE HCL 5 MG PO TABS: 5.0000 mg | ORAL_TABLET | ORAL | 0 refills | Status: DC | PRN

## 2022-07-28 MED ORDER — IOHEXOL 300 MG/ML  SOLN
100.0000 mL | Freq: Once | INTRAMUSCULAR | Status: AC | PRN
  Administered 2022-07-28: 100 mL via INTRAVENOUS

## 2022-07-28 MED ORDER — MORPHINE SULFATE (PF) 4 MG/ML IV SOLN
4.0000 mg | INTRAVENOUS | Status: AC
  Administered 2022-07-28: 4 mg via INTRAVENOUS
  Filled 2022-07-28: qty 1

## 2022-07-28 MED ORDER — ONDANSETRON HCL 4 MG/2ML IJ SOLN
4.0000 mg | Freq: Once | INTRAMUSCULAR | Status: AC
  Administered 2022-07-28: 4 mg via INTRAVENOUS
  Filled 2022-07-28: qty 2

## 2022-07-28 NOTE — ED Triage Notes (Signed)
Started having RLQ abdominal pain radiating to flank this morning. States been taking motrin with minimal relief. Denies urinary symptoms. C/o nausea when pain worsens.

## 2022-07-28 NOTE — Discharge Instructions (Signed)
You were seen for your ovarian cyst in the emergency department.   At home, please take Tylenol and ibuprofen (600 mg per dose every 6 hours for your pain).  You may also take the oxycodone we have prescribed you for any breakthrough pain that you have.    Check your MyChart online for the results of any tests that had not resulted by the time you left the emergency department.   Follow-up with your primary doctor in 2-3 days regarding your visit.  Call Dr. Garwin Brothers about an appointment tomorrow.  Return immediately to the emergency department if you experience any of the following: Worsening pain, fevers, or any other concerning symptoms.    Thank you for visiting our Emergency Department. It was a pleasure taking care of you today.

## 2022-07-28 NOTE — ED Notes (Signed)
Patient transported to US 

## 2022-07-28 NOTE — ED Notes (Signed)
Back from US.

## 2022-07-28 NOTE — ED Provider Notes (Signed)
[facility] Provider Note   CSN: [id number] Arrival date & time: 07/28/22  1709     History {Add pertinent medical, surgical, social history, OB history to HPI:1} Chief Complaint  Patient presents with   Flank Pain    Deborah Chavez is a 47 y.o. female.  47 yo F with hx of asthma, DM, tubal ligation, and c-section who presents with RLQ pain.  Patient states that this morning started experiencing intermittent right lower quadrant abdominal pain that radiates to her right flank.  Says that it felt like a cramping sensation in her right lower quadrant.  Says that she is due for her period soon.  Says that it has been worsening and she has been trying ibuprofen for the pain with last dose at 430 pm.  No vaginal discharge or bleeding recently.  No dysuria or frequency.  No nausea or vomiting or diarrhea.  No fevers but says that she occasionally feels warm.  No additional abdominal surgeries other than those listed above.       Home Medications Prior to Admission medications   Medication Sig Start Date End Date Taking? Authorizing Provider  albuterol (PROVENTIL HFA;VENTOLIN HFA) 108 (90 BASE) MCG/ACT inhaler Inhale 2 puffs into the lungs every 6 (six) hours as needed.    [provider]  Continuous Blood Gluc Receiver (FREESTYLE LIBRE 2 READER) DEVI Scan as needed for continuous glucose monitoring. 02/14/21     Continuous Blood Gluc Sensor (FREESTYLE LIBRE 2 SENSOR) MISC APPLY ONE SENSOR TO CLEAN NONDOMINATE ARM EVERY 14 DAYS 09/25/20   Lucianne Lei, MD  Continuous Blood Gluc Sensor (FREESTYLE LIBRE 2 SENSOR) MISC Apply 1 sensor to clean nondominate arm every 14 days 03/03/22     levETIRAcetam (KEPPRA) 1000 MG tablet Take 1 tablet (1,000 mg total) by mouth 2 (two) times daily. 11/24/21   Alric Ran, MD  montelukast (SINGULAIR) 10 MG tablet Take 10 mg by mouth at bedtime.    [provider]  Semaglutide,0.25 or 0.5MG /DOS, (OZEMPIC, 0.25 OR 0.5  MG/DOSE,) 2 MG/3ML SOPN Inject 0.5 mg into the skin once a week. 05/25/22     SUMAtriptan (IMITREX) 50 MG tablet Take 1 tablet (50 mg total) by mouth every 2 (two) hours as needed for migraine. May repeat in 2 hours if headache persists or recurs. 05/13/22   Alric Ran, MD  Norethindrone Acetate-Ethinyl Estrad-FE (BLISOVI 24 FE) 1-20 MG-MCG(24) tablet Take 1 tablet by mouth daily. 12/11/20 02/01/21        Allergies    Aspirin, Lactose, Lactose intolerance (gi), Mobic [meloxicam], Tramadol hcl, Ultram [tramadol hcl], Vioxx [rofecoxib], Other, and Sulfa antibiotics    Review of Systems   Review of Systems  Physical Exam Updated Vital Signs BP 133/77 (BP Location: Right Arm)   Pulse 100   Temp 98.3 F (36.8 C) (Oral)   Resp 18   Ht 5\' 6"  (1.676 m)   Wt 73 kg   LMP 06/30/2022 (Exact Date)   SpO2 100%   BMI 25.99 kg/m  Physical Exam Vitals and nursing note reviewed.  Constitutional:      General: She is not in acute distress.    Appearance: She is well-developed.  HENT:     Head: Normocephalic and atraumatic.     Right Ear: External ear normal.     Left Ear: External ear normal.     Nose: Nose normal.  Eyes:     Extraocular Movements: Extraocular movements intact.     Conjunctiva/sclera: Conjunctivae  normal.     Pupils: Pupils are equal, round, and reactive to light.  Cardiovascular:     Rate and Rhythm: Normal rate and regular rhythm.  Pulmonary:     Effort: Pulmonary effort is normal. No respiratory distress.  Abdominal:     General: Abdomen is flat. There is no distension.     Palpations: Abdomen is soft. There is no mass.     Tenderness: There is abdominal tenderness (Right lower quadrant). There is no right CVA tenderness, left CVA tenderness or guarding.  Musculoskeletal:     Cervical back: Normal range of motion and neck supple.  Skin:    General: Skin is warm and dry.  Neurological:     Mental Status: She is alert and oriented to person, place, and time. Mental  status is at baseline.  Psychiatric:        Mood and Affect: Mood normal.     ED Results / Procedures / Treatments   Labs (all labs ordered are listed, but only abnormal results are displayed) Labs Reviewed  URINALYSIS, ROUTINE W REFLEX MICROSCOPIC  PREGNANCY, URINE  COMPREHENSIVE METABOLIC PANEL  LIPASE, BLOOD  CBC WITH DIFFERENTIAL/PLATELET    EKG None  Radiology No results found.  Procedures Procedures   Medications Ordered in ED Medications  morphine (PF) 4 MG/ML injection 4 mg (has no administration in time range)  ondansetron (ZOFRAN) injection 4 mg (has no administration in time range)  acetaminophen (TYLENOL) tablet 1,000 mg (has no administration in time range)    ED Course/ Medical Decision Making/ A&P                           Medical Decision Making Amount and/or Complexity of Data Reviewed Labs: ordered. Radiology: ordered.  Risk OTC drugs. Prescription drug management.   Deborah Chavez is a 47 y.o. female with comorbidities that complicate the patient evaluation including tubal ligation and C-section who presents to the emergency department with right lower quadrant pain and right flank pain  Initial Ddx:  Kidney stone, appendicitis, ovarian cyst, ovarian torsion  MDM:  Feel that patient may have a kidney stone or appendicitis based on her symptoms.  May also have ovarian cyst which will show up on CT.  Considered ovarian torsion as well but given her age feel that this is slightly less likely.  Also has had tubal ligation already.  Will consider transvaginal ultrasound based on CT results.  Plan:  Labs Urinalysis CT abdomen pelvis IV contrast Morphine Zofran  ED Summary/Re-evaluation:  ***  This patient presents to the ED for concern of complaints listed in HPI, this involves an extensive number of treatment options, and is a complaint that carries with it a high risk of complications and morbidity. Disposition including potential need for  admission considered.   Dispo: {Disposition:28069}  Additional history obtained from {Additional History:28067} Records reviewed {Records Reviewed:28068} The following labs were independently interpreted: {labs interpreted:28064} and show {lab findings:28250} I independently reviewed the following imaging with scope of interpretation limited to determining acute life threatening conditions related to emergency care: {imaging interpreted:28065} and agree with the radiologist interpretation with the following exceptions: *** I personally reviewed and interpreted cardiac monitoring: {cardiac monitoring:28251} I personally reviewed and interpreted the pt's EKG: see above for interpretation  I have reviewed the patients home medications and made adjustments as needed Consults: {Consultants:28063} Social Determinants of health:  ***  {Document critical care time when appropriate:1} {Document POCUS if Performed:1}   {  Document critical care time when appropriate:1} {Document review of labs and clinical decision tools ie heart score, Chads2Vasc2 etc:1}  {Document your independent review of radiology images, and any outside records:1} {Document your discussion with family members, caretakers, and with consultants:1} {Document social determinants of health affecting pt's care:1} {Document your decision making why or why not admission, treatments were needed:1} Final Clinical Impression(s) / ED Diagnoses Final diagnoses:  None    Rx / DC Orders ED Discharge Orders     None

## 2022-07-30 ENCOUNTER — Other Ambulatory Visit (HOSPITAL_COMMUNITY): Payer: Self-pay

## 2022-07-30 MED ORDER — IBUPROFEN 600 MG PO TABS
600.0000 mg | ORAL_TABLET | Freq: Every day | ORAL | 1 refills | Status: AC
  Filled 2022-07-30: qty 30, 30d supply, fill #0
  Filled 2022-09-02: qty 30, 30d supply, fill #1

## 2022-08-11 ENCOUNTER — Other Ambulatory Visit (HOSPITAL_COMMUNITY)
Admission: RE | Admit: 2022-08-11 | Discharge: 2022-08-11 | Disposition: A | Discharge status: Home / Self Care | Payer: 59 | Source: Ambulatory Visit | Attending: Family Medicine | Admitting: Family Medicine

## 2022-08-11 DIAGNOSIS — Z124 Encounter for screening for malignant neoplasm of cervix: Secondary | ICD-10-CM | POA: Insufficient documentation

## 2022-08-13 ENCOUNTER — Other Ambulatory Visit (HOSPITAL_COMMUNITY): Payer: Self-pay

## 2022-08-15 LAB — CYTOLOGY - PAP
Adequacy: ABSENT
Chlamydia: NEGATIVE
Comment: NEGATIVE
Comment: NEGATIVE
Comment: NORMAL
Diagnosis: NEGATIVE
Neisseria Gonorrhea: NEGATIVE
Trichomonas: NEGATIVE

## 2022-08-18 ENCOUNTER — Other Ambulatory Visit (HOSPITAL_COMMUNITY): Payer: Self-pay

## 2022-08-18 MED ORDER — LINACLOTIDE 72 MCG PO CAPS
72.0000 ug | ORAL_CAPSULE | Freq: Every morning | ORAL | 1 refills | Status: DC
  Filled 2022-08-18: qty 30, 30d supply, fill #0
  Filled 2022-09-02: qty 90, 90d supply, fill #0
  Filled 2022-09-27: qty 30, 30d supply, fill #0
  Filled 2022-11-09: qty 30, 30d supply, fill #1
  Filled 2022-12-17: qty 30, 30d supply, fill #2
  Filled 2023-01-10: qty 30, 30d supply, fill #3
  Filled 2023-02-23: qty 30, 30d supply, fill #4
  Filled 2023-04-04: qty 30, 30d supply, fill #5

## 2022-08-19 ENCOUNTER — Other Ambulatory Visit (HOSPITAL_COMMUNITY): Payer: Self-pay

## 2022-08-20 ENCOUNTER — Other Ambulatory Visit (HOSPITAL_COMMUNITY): Payer: Self-pay

## 2022-09-02 ENCOUNTER — Other Ambulatory Visit: Payer: Self-pay | Admitting: Neurology

## 2022-09-02 ENCOUNTER — Other Ambulatory Visit (HOSPITAL_COMMUNITY): Payer: Self-pay

## 2022-09-02 ENCOUNTER — Other Ambulatory Visit: Payer: Self-pay | Admitting: Family Medicine

## 2022-09-02 DIAGNOSIS — N8500 Endometrial hyperplasia, unspecified: Secondary | ICD-10-CM

## 2022-09-03 ENCOUNTER — Other Ambulatory Visit (HOSPITAL_COMMUNITY): Payer: Self-pay

## 2022-09-03 MED ORDER — SUMATRIPTAN SUCCINATE 50 MG PO TABS
50.0000 mg | ORAL_TABLET | ORAL | 0 refills | Status: DC | PRN
  Filled 2022-09-03 – 2023-08-10 (×2): qty 10, 30d supply, fill #0

## 2022-09-08 ENCOUNTER — Ambulatory Visit: Payer: 59 | Admitting: Neurology

## 2022-09-08 ENCOUNTER — Encounter: Payer: Self-pay | Admitting: Neurology

## 2022-09-08 VITALS — BP 118/69 | HR 103 | Ht 64.0 in | Wt 160.5 lb

## 2022-09-08 DIAGNOSIS — R569 Unspecified convulsions: Secondary | ICD-10-CM

## 2022-09-08 NOTE — Patient Instructions (Signed)
Continue with Keppra 1000 mg twice daily Will follow-up with Dr. For EMU admission for capture and characterization Return in 6 months or sooner if worse.

## 2022-09-08 NOTE — Progress Notes (Signed)
[facility]  PATIENT: Deborah Chavez DOB: 03/17/1976  REFERRING CLINICIAN: Lucianne Lei, MD HISTORY FROM: Patient  REASON FOR VISIT: Episode of amnesia.    HISTORICAL  CHIEF COMPLAINT:  Chief Complaint  Patient presents with   Follow-up    Rm 12. Alone. No new concerns.   INTERVAL HISTORY 09/08/2022 Patient presents today for follow-up, last visit was in August.  At that time we had planned to continue her on Keppra 1000 mg twice daily.  She reported having another event in October described as a big jerk before falling asleep. She did not remember anything after that and woke up headaches and feeling groggy. We had planned to send her to the EMU for capture and characterization.  She has not had the opportunity scheduled that visit.  She reports overall she is doing well is compliant with the medicine denies any side effects. She still complaints of occasional headaches, left side headaches, described as pressure pain. Headaches once a month. Reports the Imitrex knock her out, 200 mg of Ibuprofen helps alleviate the pain.    INTERVAL HISTORY 02/24/2022:  Patient presents today for follow up. At last visit, plan was to increase Keppra to 1000 mg BID and to obtain an ambulatory EEG. EEG completed, she had one event during the first night, when she felt a jolt like episode followed by confusion. On EEG, there were no changes to the background. She was able to press the button but was unable to write down, she attempted but couldn't but again, EEG with no seizure.  Since being on Keppra 1000 mg BID, she denies any further episodes and denies side effects from the medications.    INTERVAL HISTORY 11/24/2021:  Patient presents today for follow-up, last visit was February 8.  At that time she was reporting 3 events per month.  I started her on levetiracetam 500 mg twice daily and since then she is reporting 2 events per month.  There are instances where she will forget to take  her medication but she is not sure if those event was related to the fact that she is forgetting the medication.  There was one event when she reported waking up with tongue biting and muscle soreness.  She denies any side effect from the [place].   INTERVAL HISTORY 08/27/2021:  Patient presents today for follow-up, at last visit plan was to obtain a routine EEG which was normal, did not show any epileptiform discharge.  She continues to experience the episodes, up to 3 times per month.  On December 15 she reported left-sided headache with onset with nausea and blurry vision, on December 25 she reported memory loss in the evening she felt extremely wiped out and did not remember the day's events. Her last episode was January 25 she remembered being at work then noted that she had to shred some paper but could not find a paper to shred. Then after finding a coworker to help her, it was noted that patient already shared the paper, she does not know how and when she walked to the shredder and shred the paper.  Again denies any frank seizure or seizure-like activity.     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  She said in the beginning of summer she woke up thinking it was a Saturday when in fact it was  Sunday, she cannot really recall anything that happened on that Saturday other than what was on her text messages but she cannot even remember sending them.  She said she checked her blood glucose monitor and it was in her 77s therefore she was sent to see endocrine. She does not currently have a diagnosis of diabetes but she is prediabetic and based on a blood glucose monitor she has not been on extended period of hypoglycemia.  Since that she report episode of gap in memory that she cannot remember, or recall, she followed up with endocrine again and was referred  here to rule out seizures since he had a history of seizure as a child.  She also reported that 2 of her kids have seizure and the they are both on medication.  She denies waking up with blood in the pillow, denies waking up on the floor, denies any additional seizures since the first seizure that she had as a child, at that time she states she was not treated with medication.   Denies any history of stroke or head trauma.    OTHER MEDICAL CONDITIONS: Seizure, Prediabetes, Asthma, abnormal PAP   REVIEW OF SYSTEMS: Full 14 system review of systems performed and negative with exception of: Seizure   ALLERGIES: Allergies  Allergen Reactions   Aspirin    Lactose     Other reaction(s): GI Upset (intolerance)   Lactose Intolerance (Gi)    Mobic [Meloxicam] Other (See Comments)    Causes extreme fatigue and sleepiness   Tramadol Hcl     Other reaction(s): Other (See Comments) EKG changes   Ultram [Tramadol Hcl]    Vioxx [Rofecoxib]    Other Rash    TB test causes rashes and streaks    Sulfa Antibiotics Rash    HOME MEDICATIONS: Outpatient Medications Prior to Visit  Medication Sig Dispense Refill   albuterol (PROVENTIL HFA;VENTOLIN HFA) 108 (90 BASE) MCG/ACT inhaler Inhale 2 puffs into the lungs every 6 (six) hours as needed.     budesonide-formoterol (SYMBICORT) 160-4.5 MCG/ACT inhaler Inhale 2 puffs into the lungs 2 (two) times daily.     Continuous Blood Gluc Receiver (FREESTYLE LIBRE 2 READER) DEVI Scan as needed for continuous glucose monitoring. 1 each 0   Continuous Blood Gluc Sensor (FREESTYLE LIBRE 2 SENSOR) MISC APPLY ONE SENSOR TO CLEAN NONDOMINATE ARM EVERY 14 DAYS 2 each 3   Continuous Blood Gluc Sensor (FREESTYLE LIBRE 2 SENSOR) MISC Apply 1 sensor to clean nondominate arm every 14 days 6 each 2   ibuprofen (ADVIL) 600 MG tablet Take 1 tablet (600 mg total) by mouth at bedtime for pain 30 tablet 1   levETIRAcetam (KEPPRA) 1000 MG tablet Take 1 tablet (1,000 mg total) by  mouth 2 (two) times daily. 60 tablet 11   linaclotide (LINZESS) 72 MCG capsule Take 1 capsule (72 mcg total) by mouth in the morning on an empty stomach, no food for 30 minutes 90 capsule 1   montelukast (SINGULAIR) 10 MG tablet Take 10 mg by mouth at bedtime.     Semaglutide,0.25 or 0.5MG/DOS, (OZEMPIC, 0.25 OR 0.5 MG/DOSE,) 2 MG/3ML SOPN Inject 0.5 mg into the skin once a week. 9 mL 1   SUMAtriptan (IMITREX) 50 MG tablet Take 1 tablet (50 mg total) by mouth every 2 (two) hours as needed for migraine. May repeat in 2 hours if headache persists or recurs. 10 tablet 0   oxyCODONE (ROXICODONE) 5 MG immediate release tablet Take 1 tablet (5 mg total)  by mouth every 4 (four) hours as needed for severe pain. 12 tablet 0   No facility-administered medications prior to visit.    PAST MEDICAL HISTORY: Past Medical History:  Diagnosis Date   AGUS favor dysplasia    Allergy    Anemia    Asthma    Diabetes mellitus without complication ([place])    Prediabetes    UTI (lower urinary tract infection)     PAST SURGICAL HISTORY: Past Surgical History:  Procedure Laterality Date   CESAREAN SECTION     TUBAL LIGATION      FAMILY HISTORY: Family History  Problem Relation Age of Onset   Diabetes Mother    Hyperlipidemia Mother    Stroke Mother    Diabetes Maternal Grandmother    Cancer Maternal Grandmother    Hypertension Maternal Grandmother    Hyperlipidemia Father    Hypertension Father    Stroke Father    Cancer Sister    Hypertension Sister    Hyperlipidemia Maternal Grandfather    Diabetes Maternal Grandfather    Stroke Maternal Grandfather    Cancer Paternal Grandmother    Diabetes Maternal Aunt    Diabetes Maternal Uncle    Cancer Other    Stroke Other    Hypertension Other     SOCIAL HISTORY: Social History   Socioeconomic History   Marital status: Widowed    Spouse name: Not on file   Number of children: Not on file   Years of education: Not on file   Highest  education level: Not on file  Occupational History   Not on file  Tobacco Use   Smoking status: Never   Smokeless tobacco: Never  Substance and Sexual Activity   Alcohol use: No   Drug use: No   Sexual activity: Not on file  Other Topics Concern   Not on file  Social History Narrative   Not on file   Social Determinants of Health   Financial Resource Strain: Not on file  Food Insecurity: Not on file  Transportation Needs: Not on file  Physical Activity: Not on file  Stress: Not on file  Social Connections: Not on file  Intimate Partner Violence: Not on file    PHYSICAL EXAM  GENERAL EXAM/CONSTITUTIONAL: Vitals:  Vitals:   09/08/22 1159  BP: 118/69  Pulse: (!) 103  Weight: 160 lb 8 oz (72.8 kg)  Height: 5' 4"$  (1.626 m)   Body mass index is 27.55 kg/m. Wt Readings from Last 3 Encounters:  09/08/22 160 lb 8 oz (72.8 kg)  07/28/22 161 lb (73 kg)  05/12/22 153 lb (69.4 kg)   Patient is in no distress; well developed, nourished and groomed; neck is supple  EYES: Visual fields full to confrontation, Extraocular movements intacts,   MUSCULOSKELETAL: Gait, strength, tone, movements noted in Neurologic exam below  NEUROLOGIC: MENTAL STATUS:      No data to display         awake, alert, oriented to person, place and time recent and remote memory intact normal attention and concentration language fluent, comprehension intact, naming intact fund of knowledge appropriate  CRANIAL NERVE:  2nd, 3rd, 4th, 6th - visual fields full to confrontation, extraocular muscles intact, no nystagmus 5th - facial sensation symmetric 7th - facial strength symmetric 8th - hearing intact 9th - palate elevates symmetrically, uvula midline 11th - shoulder shrug symmetric 12th - tongue protrusion midline  MOTOR:  normal bulk and tone, full strength in the BUE, BLE  SENSORY:  normal and symmetric to light touch, pinprick, temperature, vibration  COORDINATION:   finger-nose-finger, fine finger movements normal  REFLEXES:  deep tendon reflexes present and symmetric  GAIT/STATION:  normal   DIAGNOSTIC DATA (LABS, IMAGING, TESTING) - I reviewed patient records, labs, notes, testing and imaging myself where available.  Lab Results  Component Value Date   WBC 10.2 07/28/2022   HGB 11.9 (L) 07/28/2022   HCT 36.2 07/28/2022   MCV 89.6 07/28/2022   PLT 322 07/28/2022      Component Value Date/Time   NA 131 (L) 07/28/2022 1727   K 4.0 07/28/2022 1727   CL 97 (L) 07/28/2022 1727   CO2 22 07/28/2022 1727   GLUCOSE 122 (H) 07/28/2022 1727   BUN 15 07/28/2022 1727   CREATININE 0.79 07/28/2022 1727   CALCIUM 9.7 07/28/2022 1727   PROT 7.7 07/28/2022 1727   ALBUMIN 4.1 07/28/2022 1727   AST 19 07/28/2022 1727   ALT 13 07/28/2022 1727   ALKPHOS 69 07/28/2022 1727   BILITOT 0.3 07/28/2022 1727   GFRNONAA >60 07/28/2022 1727   No results found for: "CHOL", "HDL", "LDLCALC", "LDLDIRECT", "TRIG", "CHOLHDL" Lab Results  Component Value Date   HGBA1C 6.3 05/27/2016   No results found for: "VITAMINB12" No results found for: "TSH"   MRI Brain with and without contrast 09/2020 Normal examination   Routine EEG 06/03/2021:  This is a normal EEG recording in the waking and sleeping state. No evidence interictal epileptiform discharges were seen at any time during the recording.  A normal EEG does not exclude a diagnosis of epilepsy.   Ambulatory  EEG 01/17/2022 This is a normal 45 hours ambulatory video EEG, no seizures captured  ASSESSMENT AND PLAN  47 y.o. year old female with past medical history of seizure as a child, abnormal Pap and prediabetes who is presenting for follow up for her seizure like event. She is on Keppra 1000 mg BID, denies any side effects. She continues to complaints of seizure like event. I have requested an EMU admission for capture and characterization but has not completed. We will follow up to get her schedule.  I  will see her in 6 months for follow up.    1. Seizure-like activity [facility])     Patient Instructions  Continue with Keppra 1000 mg twice daily Will follow-up with Dr. For EMU admission for capture and characterization Return in 6 months or sooner if worse.   No orders of the defined types were placed in this encounter.   No orders of the defined types were placed in this encounter.    Return in about 6 months (around 03/09/2023).  I have spent a total of 30 minutes dedicated to this patient today, preparing to see patient, performing a medically appropriate examination and evaluation, ordering tests and/or medications and procedures, and counseling and educating the patient/family/caregiver; independently interpreting result and communicating results to the family/patient/caregiver; and documenting clinical information in the electronic medical record.   Alric Ran, MD 09/08/2022, 12:54 PM  [facility] [address] [place], [place] [address] [phone]

## 2022-09-21 ENCOUNTER — Other Ambulatory Visit (HOSPITAL_COMMUNITY): Payer: Self-pay

## 2022-09-28 ENCOUNTER — Other Ambulatory Visit (HOSPITAL_COMMUNITY): Payer: Self-pay

## 2022-09-28 ENCOUNTER — Other Ambulatory Visit: Payer: Self-pay

## 2022-10-06 ENCOUNTER — Other Ambulatory Visit: Payer: 59

## 2022-11-17 ENCOUNTER — Encounter: Payer: Self-pay | Admitting: Neurology

## 2022-11-18 DIAGNOSIS — Z0289 Encounter for other administrative examinations: Secondary | ICD-10-CM

## 2022-11-19 ENCOUNTER — Telehealth: Payer: Self-pay | Admitting: *Deleted

## 2022-11-19 NOTE — Telephone Encounter (Signed)
Paperwork completed and placed in outgoing MR 

## 2022-11-19 NOTE — Telephone Encounter (Signed)
Pt fmla form faxed on 11/19/2022

## 2022-11-26 ENCOUNTER — Encounter: Payer: Self-pay | Admitting: Neurology

## 2022-12-01 NOTE — Telephone Encounter (Signed)
Yes, it is an elective but I will see her as scheduled at follow up. We will discuss it more in details.

## 2022-12-07 ENCOUNTER — Inpatient Hospital Stay (HOSPITAL_COMMUNITY): Admission: RE | Admit: 2022-12-07 | Payer: 59 | Source: Ambulatory Visit | Admitting: Neurology

## 2022-12-17 ENCOUNTER — Other Ambulatory Visit (HOSPITAL_COMMUNITY): Payer: Self-pay

## 2022-12-17 ENCOUNTER — Other Ambulatory Visit: Payer: Self-pay | Admitting: Neurology

## 2022-12-17 ENCOUNTER — Other Ambulatory Visit: Payer: Self-pay

## 2022-12-17 MED ORDER — LEVETIRACETAM 1000 MG PO TABS
1000.0000 mg | ORAL_TABLET | Freq: Two times a day (BID) | ORAL | 11 refills | Status: DC
  Filled 2022-12-17: qty 60, 30d supply, fill #0
  Filled 2023-01-10: qty 60, 30d supply, fill #1
  Filled 2023-02-23: qty 60, 30d supply, fill #2
  Filled 2023-04-04: qty 60, 30d supply, fill #3
  Filled 2023-05-25: qty 60, 30d supply, fill #4
  Filled 2023-08-10: qty 60, 30d supply, fill #5

## 2022-12-17 MED ORDER — GVOKE HYPOPEN 2-PACK 1 MG/0.2ML ~~LOC~~ SOAJ
SUBCUTANEOUS | 4 refills | Status: AC
  Filled 2022-12-17: qty 0.4, 2d supply, fill #0
  Filled 2023-05-25: qty 0.4, 2d supply, fill #1
  Filled 2023-08-10: qty 0.4, 2d supply, fill #2
  Filled 2023-10-13: qty 0.4, 2d supply, fill #3

## 2022-12-17 MED ORDER — FREESTYLE LIBRE 3 SENSOR MISC
1 refills | Status: AC
  Filled 2022-12-17 – 2022-12-18 (×2): qty 2, 28d supply, fill #0
  Filled 2023-01-10: qty 2, 28d supply, fill #1
  Filled 2023-02-23: qty 2, 28d supply, fill #2
  Filled 2023-04-04: qty 2, 28d supply, fill #3
  Filled 2023-05-25: qty 2, 28d supply, fill #4
  Filled 2023-08-10: qty 2, 28d supply, fill #5

## 2022-12-17 MED ORDER — GVOKE HYPOPEN 2-PACK 1 MG/0.2ML ~~LOC~~ SOAJ: SUBCUTANEOUS | 4 refills | Status: AC

## 2022-12-18 ENCOUNTER — Other Ambulatory Visit (HOSPITAL_COMMUNITY): Payer: Self-pay

## 2022-12-21 ENCOUNTER — Other Ambulatory Visit (HOSPITAL_COMMUNITY): Payer: Self-pay

## 2022-12-24 ENCOUNTER — Other Ambulatory Visit (HOSPITAL_COMMUNITY): Payer: Self-pay

## 2022-12-24 MED ORDER — ACCU-CHEK GUIDE VI STRP
ORAL_STRIP | 3 refills | Status: AC
  Filled 2022-12-24: qty 100, 50d supply, fill #0
  Filled 2023-05-25: qty 100, 50d supply, fill #1

## 2022-12-24 MED ORDER — ACCU-CHEK FASTCLIX LANCETS MISC
3 refills | Status: AC
  Filled 2022-12-24: qty 102, 30d supply, fill #0
  Filled 2023-05-25: qty 102, 30d supply, fill #1
  Filled 2023-10-13: qty 102, 30d supply, fill #2

## 2022-12-25 ENCOUNTER — Other Ambulatory Visit (HOSPITAL_COMMUNITY): Payer: Self-pay

## 2023-01-10 ENCOUNTER — Other Ambulatory Visit (HOSPITAL_COMMUNITY): Payer: Self-pay

## 2023-01-11 ENCOUNTER — Other Ambulatory Visit: Payer: Self-pay

## 2023-01-11 ENCOUNTER — Other Ambulatory Visit (HOSPITAL_COMMUNITY): Payer: Self-pay

## 2023-01-11 MED ORDER — SEMAGLUTIDE(0.25 OR 0.5MG/DOS) 2 MG/3ML ~~LOC~~ SOPN
0.5000 mg | PEN_INJECTOR | SUBCUTANEOUS | 1 refills | Status: DC
  Filled 2023-01-11: qty 3, 28d supply, fill #0
  Filled 2023-02-23: qty 3, 28d supply, fill #1
  Filled 2023-04-04: qty 3, 28d supply, fill #2
  Filled 2023-05-25: qty 3, 28d supply, fill #3
  Filled 2023-08-10: qty 3, 28d supply, fill #4
  Filled 2023-10-13: qty 3, 28d supply, fill #5

## 2023-01-11 MED ORDER — TGT BLOOD GLUCOSE MONITORING W/DEVICE KIT
PACK | 0 refills | Status: AC
  Filled 2023-01-11: qty 1, 30d supply, fill #0

## 2023-01-12 ENCOUNTER — Other Ambulatory Visit (HOSPITAL_COMMUNITY): Payer: Self-pay

## 2023-01-18 ENCOUNTER — Other Ambulatory Visit (HOSPITAL_COMMUNITY): Payer: Self-pay

## 2023-02-23 ENCOUNTER — Other Ambulatory Visit (HOSPITAL_COMMUNITY): Payer: Self-pay

## 2023-03-16 ENCOUNTER — Encounter: Payer: Self-pay | Admitting: Neurology

## 2023-03-16 ENCOUNTER — Ambulatory Visit: Payer: 59 | Admitting: Neurology

## 2023-05-17 DIAGNOSIS — Z0289 Encounter for other administrative examinations: Secondary | ICD-10-CM

## 2023-05-24 DIAGNOSIS — Z0289 Encounter for other administrative examinations: Secondary | ICD-10-CM

## 2023-05-25 ENCOUNTER — Other Ambulatory Visit: Payer: Self-pay

## 2023-05-25 ENCOUNTER — Other Ambulatory Visit (HOSPITAL_COMMUNITY): Payer: Self-pay

## 2023-05-25 MED ORDER — ERGOCALCIFEROL 1.25 MG (50000 UT) PO CAPS
50000.0000 [IU] | ORAL_CAPSULE | ORAL | 3 refills | Status: AC
  Filled 2023-05-25: qty 4, 28d supply, fill #0
  Filled 2023-08-10: qty 4, 28d supply, fill #1
  Filled 2023-10-13: qty 4, 28d supply, fill #2
  Filled 2023-11-11 – 2024-01-02 (×2): qty 4, 28d supply, fill #3
  Filled 2024-01-30: qty 4, 28d supply, fill #4
  Filled 2024-03-26: qty 4, 28d supply, fill #5
  Filled 2024-04-20: qty 4, 28d supply, fill #6

## 2023-05-27 ENCOUNTER — Telehealth: Payer: Self-pay

## 2023-05-27 NOTE — Telephone Encounter (Signed)
FMLA paperwork completed and in MD office for review and signature

## 2023-06-03 DIAGNOSIS — Z0289 Encounter for other administrative examinations: Secondary | ICD-10-CM

## 2023-06-08 ENCOUNTER — Other Ambulatory Visit (HOSPITAL_COMMUNITY): Payer: Self-pay

## 2023-06-23 NOTE — Telephone Encounter (Signed)
Received fax from [facility] stating that the previously sent form was incomplete or insufficient to support the patients request. Bringing to POD 2 for review

## 2023-08-10 ENCOUNTER — Other Ambulatory Visit (HOSPITAL_COMMUNITY): Payer: Self-pay

## 2023-08-11 ENCOUNTER — Other Ambulatory Visit: Payer: Self-pay

## 2023-08-11 ENCOUNTER — Other Ambulatory Visit (HOSPITAL_COMMUNITY): Payer: Self-pay

## 2023-08-13 ENCOUNTER — Other Ambulatory Visit (HOSPITAL_COMMUNITY): Payer: Self-pay

## 2023-08-24 ENCOUNTER — Encounter: Payer: Self-pay | Admitting: Neurology

## 2023-08-24 ENCOUNTER — Other Ambulatory Visit (HOSPITAL_COMMUNITY): Payer: Self-pay

## 2023-08-24 ENCOUNTER — Ambulatory Visit: Payer: 59 | Admitting: Neurology

## 2023-08-24 VITALS — BP 131/83 | HR 92 | Ht 64.0 in | Wt 158.5 lb

## 2023-08-24 DIAGNOSIS — R569 Unspecified convulsions: Secondary | ICD-10-CM

## 2023-08-24 DIAGNOSIS — R413 Other amnesia: Secondary | ICD-10-CM | POA: Diagnosis not present

## 2023-08-24 MED ORDER — LEVETIRACETAM 1000 MG PO TABS
1000.0000 mg | ORAL_TABLET | Freq: Two times a day (BID) | ORAL | 3 refills | Status: AC
  Filled 2023-08-24: qty 180, 90d supply, fill #0
  Filled 2023-10-13: qty 60, 30d supply, fill #0
  Filled 2023-11-11 – 2024-01-02 (×2): qty 60, 30d supply, fill #1
  Filled 2024-01-30: qty 60, 30d supply, fill #2
  Filled 2024-03-26: qty 60, 30d supply, fill #3
  Filled 2024-04-26: qty 60, 30d supply, fill #4
  Filled 2024-07-09: qty 60, 30d supply, fill #5

## 2023-08-24 MED ORDER — SUMATRIPTAN SUCCINATE 50 MG PO TABS
50.0000 mg | ORAL_TABLET | ORAL | 11 refills | Status: DC | PRN
  Filled 2023-08-24: qty 10, 1d supply, fill #0

## 2023-08-24 NOTE — Progress Notes (Signed)
 [facility]  PATIENT: Deborah Chavez DOB: 1976-07-13  REFERRING CLINICIAN: Benjamine Aland, MD HISTORY FROM: Patient  REASON FOR VISIT: Episode of amnesia.    HISTORICAL  CHIEF COMPLAINT:  Chief Complaint  Patient presents with   Seizures    Rm12, alone, Sz: last episode was 08/22/23, admitted to noncompliance with sz meds. 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 She will need letter of accomodation    INTERVAL HISTORY 08/24/2023:  Patient presents today for follow-up, last visit was in February 2024.  Since then, she continued to have events but less frequent.  He tells me the last 2 events were on October 26 when she had a dizzy spell followed by confusion and the last event was on February 2 when she was confused at a friend's house. They told her that she walked to the couch, took her shoes off, and she woke up 2 hours later confused, she complained of a right hip and right leg pain.  She was not told that she was shaking or had any abnormal movements.  She tells us  that she has been nonadherent with her Keppra  and has not been taking it.  At last visit, we had planned to send her to EMU for capture and characterization but it was not approved by insurance and the cost was too much for patient. 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   INTERVAL HISTORY 09/08/2022 Patient presents today for follow-up, last visit was in August.  At that time we had planned to continue her on Keppra  1000 mg twice daily.  She reported having another event in October described as a big jerk before falling asleep. She did not remember anything after that and woke up headaches and feeling groggy. We had planned to send her to the EMU for capture and characterization.  She has not had the opportunity scheduled that  visit.  She reports overall she is doing well is compliant with the medicine denies any side effects. She still complaints of occasional headaches, left side headaches, described as pressure pain. Headaches once a month. Reports the Imitrex  knock her out, 200 mg of Ibuprofen  helps alleviate the pain.    INTERVAL HISTORY 02/24/2022:  Patient presents today for follow up. At last visit, plan was to increase Keppra  to 1000 mg BID and to obtain an ambulatory EEG. EEG completed, she had one event during the first night, when she felt a jolt like episode followed by confusion. On EEG, there were no changes to the background. She was able to press the button but was unable to write down, she attempted but couldn't but again, EEG with no seizure.  Since being on Keppra  1000 mg BID, she denies any further episodes and denies side effects from the medications.    INTERVAL HISTORY 11/24/2021:  Patient presents today for follow-up, last visit was February 8.  At that time she was reporting 3 events per month.  I started her on levetiracetam  500 mg twice daily and since then she is reporting 2 events per month.  There are instances where she will forget to take her medication but she is not sure if those event was related to the fact that she is forgetting the medication.  There was one event when  she reported waking up with tongue biting and muscle soreness.  She denies any side effect from the Keppra .   INTERVAL HISTORY 08/27/2021:  Patient presents today for follow-up, at last visit plan was to obtain a routine EEG which was normal, did not show any epileptiform discharge.  She continues to experience the episodes, up to 3 times per month.  On December 15 she reported left-sided headache with onset with nausea and blurry vision, on December 25 she reported memory loss in the evening she felt extremely wiped out and did not remember the day's events. Her last episode was January 25 she remembered being at work then  noted that she had to shred some paper but could not find a paper to shred. Then after finding a coworker to help her, it was noted that patient already shared the paper, she does not know how and when she walked to the shredder and shred the paper.  Again denies any frank seizure or seizure-like activity.     HISTORY OF PRESENT ILLNESS:  This is a 48 year old woman with past medical history of seizures as a child, prediabetes, asthma and abnormal Pap who is presenting with new concern of amnesia.  She said in the beginning of summer she woke up thinking it was a Saturday when in fact it was Sunday, she cannot really recall anything that happened on that Saturday other than what was on her text messages but she cannot even remember sending them.  She said she checked her blood glucose monitor and it was in her 31s therefore she was sent to see endocrine. She does not currently have a diagnosis of diabetes but she is prediabetic and based on a blood glucose monitor she has not been on extended period of hypoglycemia.  Since that she report episode of gap in memory that she cannot remember, or recall, she followed up with endocrine again and was referred here to rule out seizures since he had a history of seizure as a child.  She also reported that 2 of her kids have seizure and the they are both on medication.  She denies waking up with blood in the pillow, denies waking up on the floor, denies any additional seizures since the first seizure that she had as a child, at that time she states she was not treated with medication.   Denies any history of stroke or head trauma.    OTHER MEDICAL CONDITIONS: Seizure, Prediabetes, Asthma, abnormal PAP   REVIEW OF SYSTEMS: Full 14 system review of systems performed and negative with exception of: Seizure   ALLERGIES: Allergies  Allergen Reactions   Aspirin    Lactose     Other reaction(s): GI Upset (intolerance)   Lactose Intolerance (Gi)    Mobic   [Meloxicam ] Other (See Comments)    Causes extreme fatigue and sleepiness   Tramadol Hcl     Other reaction(s): Other (See Comments) EKG changes   Ultram [Tramadol Hcl]    Vioxx [Rofecoxib]    Other Rash    TB test causes rashes and streaks    Sulfa Antibiotics Rash    HOME MEDICATIONS: Outpatient Medications Prior to Visit  Medication Sig Dispense Refill   Accu-Chek FastClix Lancets MISC use to check blood sugar 2 times daily as instructed 102 each 3   ACCU-CHEK GUIDE test strip Use to check blood sugar 2 times daily as instructed 100 strip 3   albuterol  (PROVENTIL  HFA;VENTOLIN  HFA) 108 (90 BASE) MCG/ACT inhaler Inhale 2 puffs into  the lungs every 6 (six) hours as needed.     Blood Glucose Monitoring Suppl (TGT BLOOD GLUCOSE MONITORING) w/Device KIT Use as directed to check blood sugar. 1 kit 0   budesonide -formoterol  (SYMBICORT ) 160-4.5 MCG/ACT inhaler Inhale 2 puffs into the lungs 2 (two) times daily.     Continuous Blood Gluc Receiver (FREESTYLE LIBRE 2 READER) DEVI Scan as needed for continuous glucose monitoring. 1 each 0   Continuous Blood Gluc Sensor (FREESTYLE LIBRE 2 SENSOR) MISC APPLY ONE SENSOR TO CLEAN NONDOMINATE ARM EVERY 14 DAYS 2 each 3   Continuous Blood Gluc Sensor (FREESTYLE LIBRE 2 SENSOR) MISC Apply 1 sensor to clean nondominate arm every 14 days 6 each 2   Continuous Glucose Sensor (FREESTYLE LIBRE 3 SENSOR) MISC Check glucose as directed (before meals, 2 hours after meals, every morning, and at bedtime) 6 each 1   ergocalciferol  (VITAMIN D2) 1.25 MG (50000 UT) capsule Take 1 capsule (50,000 Units total) by mouth once a week. 12 capsule 3   Glucagon  (GVOKE HYPOPEN  2-PACK) 1 MG/0.2ML SOAJ USE AS DIRECTED WHEN NEEDED IF BLOOD SUGAR DROPS TO 54 OR LOWER 0.4 mL 4   Glucagon  (GVOKE HYPOPEN  2-PACK) 1 MG/0.2ML SOAJ Use as directed when needed if blood sugar drops to 54 or lower 0.4 mL 4   ibuprofen  (ADVIL ) 600 MG tablet Take 1 tablet (600 mg total) by mouth at bedtime for  pain 30 tablet 1   linaclotide  (LINZESS ) 72 MCG capsule Take 1 capsule (72 mcg total) by mouth in the morning on an empty stomach, no food for 30 minutes 90 capsule 1   montelukast (SINGULAIR) 10 MG tablet Take 10 mg by mouth at bedtime.     Semaglutide ,0.25 or 0.5MG /DOS, (OZEMPIC , 0.25 OR 0.5 MG/DOSE,) 2 MG/3ML SOPN Inject 0.5 mg into the skin once a week. 9 mL 1   levETIRAcetam  (KEPPRA ) 1000 MG tablet Take 1 tablet (1,000 mg total) by mouth 2 (two) times daily. 60 tablet 11   SUMAtriptan  (IMITREX ) 50 MG tablet Take 1 tablet (50 mg total) by mouth as needed for migraine. May repeat in 2 hours if headache persists or recurs. 10 tablet 0   No facility-administered medications prior to visit.    PAST MEDICAL HISTORY: Past Medical History:  Diagnosis Date   AGUS favor dysplasia    Allergy    Anemia    Asthma    Diabetes mellitus without complication (HCC)    Prediabetes    UTI (lower urinary tract infection)     PAST SURGICAL HISTORY: Past Surgical History:  Procedure Laterality Date   CESAREAN SECTION     TUBAL LIGATION      FAMILY HISTORY: Family History  Problem Relation Age of Onset   Diabetes Mother    Hyperlipidemia Mother    Stroke Mother    Diabetes Maternal Grandmother    Cancer Maternal Grandmother    Hypertension Maternal Grandmother    Hyperlipidemia Father    Hypertension Father    Stroke Father    Cancer Sister    Hypertension Sister    Hyperlipidemia Maternal Grandfather    Diabetes Maternal Grandfather    Stroke Maternal Grandfather    Cancer Paternal Grandmother    Diabetes Maternal Aunt    Diabetes Maternal Uncle    Cancer Other    Stroke Other    Hypertension Other     SOCIAL HISTORY: Social History   Socioeconomic History   Marital status: Widowed    Spouse name: Not on file  Number of children: Not on file   Years of education: Not on file   Highest education level: Not on file  Occupational History   Not on file  Tobacco Use    Smoking status: Never   Smokeless tobacco: Never  Substance and Sexual Activity   Alcohol use: No   Drug use: No   Sexual activity: Not on file  Other Topics Concern   Not on file  Social History Narrative   Not on file   Social Drivers of Health   Financial Resource Strain: Not on file  Food Insecurity: Low Risk  (04/12/2023)   Received from [facility]   Hunger Vital Sign    Worried About Running Out of Food in the Last Year: Never true    Ran Out of Food in the Last Year: Never true  Transportation Needs: No Transportation Needs (04/12/2023)   Received from [facility]    In the past 12 months, has lack of reliable transportation kept you from medical appointments, meetings, work or from getting things needed for daily living? : No  Physical Activity: Not on file  Stress: Not on file  Social Connections: Not on file  Intimate Partner Violence: Not on file    PHYSICAL EXAM  GENERAL EXAM/CONSTITUTIONAL: Vitals:  Vitals:   08/24/23 1019  BP: 131/83  Pulse: 92  Weight: 158 lb 8 oz (71.9 kg)  Height: 5' 4 (1.626 m)   Body mass index is 27.21 kg/m. Wt Readings from Last 3 Encounters:  08/24/23 158 lb 8 oz (71.9 kg)  09/08/22 160 lb 8 oz (72.8 kg)  07/28/22 161 lb (73 kg)   Patient is in no distress; well developed, nourished and groomed; neck is supple  MUSCULOSKELETAL: Gait, strength, tone, movements noted in Neurologic exam below  NEUROLOGIC: MENTAL STATUS:      No data to display         awake, alert, oriented to person, place and time recent and remote memory intact normal attention and concentration language fluent, comprehension intact, naming intact fund of knowledge appropriate  CRANIAL NERVE:  2nd, 3rd, 4th, 6th - visual fields full to confrontation, extraocular muscles intact, no nystagmus 5th - facial sensation symmetric 7th - facial strength symmetric 8th - hearing intact 9th - palate elevates symmetrically, uvula  midline 11th - shoulder shrug symmetric 12th - tongue protrusion midline  MOTOR:  normal bulk and tone, full strength in the BUE, BLE  SENSORY:  normal and symmetric to light touch  COORDINATION:  finger-nose-finger, fine finger movements normal  GAIT/STATION:  normal   DIAGNOSTIC DATA (LABS, IMAGING, TESTING) - I reviewed patient records, labs, notes, testing and imaging myself where available.  Lab Results  Component Value Date   WBC 10.2 07/28/2022   HGB 11.9 (L) 07/28/2022   HCT 36.2 07/28/2022   MCV 89.6 07/28/2022   PLT 322 07/28/2022      Component Value Date/Time   NA 131 (L) 07/28/2022 1727   K 4.0 07/28/2022 1727   CL 97 (L) 07/28/2022 1727   CO2 22 07/28/2022 1727   GLUCOSE 122 (H) 07/28/2022 1727   BUN 15 07/28/2022 1727   CREATININE 0.79 07/28/2022 1727   CALCIUM 9.7 07/28/2022 1727   PROT 7.7 07/28/2022 1727   ALBUMIN 4.1 07/28/2022 1727   AST 19 07/28/2022 1727   ALT 13 07/28/2022 1727   ALKPHOS 69 07/28/2022 1727   BILITOT 0.3 07/28/2022 1727   GFRNONAA >60 07/28/2022 1727  No results found for: CHOL, HDL, LDLCALC, LDLDIRECT, TRIG, CHOLHDL Lab Results  Component Value Date   HGBA1C 6.3 05/27/2016   No results found for: VITAMINB12 No results found for: TSH   MRI Brain with and without contrast 09/2020 Normal examination   Routine EEG 06/03/2021:  This is a normal EEG recording in the waking and sleeping state. No evidence interictal epileptiform discharges were seen at any time during the recording.  A normal EEG does not exclude a diagnosis of epilepsy.   Ambulatory  EEG 01/17/2022 This is a normal 45 hours ambulatory video EEG, no seizures captured  ASSESSMENT AND PLAN  48 y.o. year old female with past medical history of seizure as a child, abnormal Pap and prediabetes who is presenting for follow up for her seizure like event. She had 2 events lately but was noncompliant with the Keppra . Plan will be to restart  Keppra  500 mg twice daily for one week then to increase to 1000 mg twice daily. Will see her in 1 year or sooner if worse.    1. Seizure-like activity (HCC)   2. Amnesia      Patient Instructions  Restart Keppra  500 mg twice daily for 1 week then increase to 1000 mg twice daily  Continue your other medications  Return in a year or sooner if worse.    No orders of the defined types were placed in this encounter.   Meds ordered this encounter  Medications   levETIRAcetam  (KEPPRA ) 1000 MG tablet    Sig: Take 1 tablet (1,000 mg total) by mouth 2 (two) times daily.    Dispense:  180 tablet    Refill:  3   SUMAtriptan  (IMITREX ) 50 MG tablet    Sig: Take 1 tablet (50 mg total) by mouth as needed for migraine. May repeat in 2 hours if headache persists or recurs.    Dispense:  10 tablet    Refill:  11     Return in about 1 year (around 08/23/2024).    Pastor Falling, MD 08/24/2023, 8:10 PM  [facility] [address], [address] [place], [place] [address] [phone]

## 2023-08-24 NOTE — Patient Instructions (Signed)
Restart Keppra 500 mg twice daily for 1 week then increase to 1000 mg twice daily  Continue your other medications  Return in a year or sooner if worse.

## 2023-10-13 ENCOUNTER — Other Ambulatory Visit (HOSPITAL_COMMUNITY): Payer: Self-pay

## 2023-10-14 ENCOUNTER — Other Ambulatory Visit (HOSPITAL_COMMUNITY): Payer: Self-pay

## 2023-10-14 ENCOUNTER — Other Ambulatory Visit: Payer: Self-pay

## 2023-10-15 ENCOUNTER — Other Ambulatory Visit (HOSPITAL_COMMUNITY): Payer: Self-pay

## 2023-10-15 MED ORDER — LINZESS 72 MCG PO CAPS
72.0000 ug | ORAL_CAPSULE | Freq: Every morning | ORAL | 1 refills | Status: AC
  Filled 2023-10-15: qty 30, 30d supply, fill #0
  Filled 2023-11-11 – 2024-01-02 (×2): qty 30, 30d supply, fill #1
  Filled 2024-01-30: qty 30, 30d supply, fill #2
  Filled 2024-03-26: qty 30, 30d supply, fill #3
  Filled 2024-04-26: qty 30, 30d supply, fill #4

## 2023-10-15 MED ORDER — FREESTYLE LIBRE 3 SENSOR MISC
1.0000 | 1 refills | Status: DC
  Filled 2023-10-15: qty 6, 84d supply, fill #0
  Filled 2024-01-02: qty 6, 84d supply, fill #1

## 2023-11-03 ENCOUNTER — Other Ambulatory Visit (HOSPITAL_COMMUNITY): Payer: Self-pay

## 2023-11-11 ENCOUNTER — Other Ambulatory Visit (HOSPITAL_COMMUNITY): Payer: Self-pay

## 2023-11-11 ENCOUNTER — Other Ambulatory Visit: Payer: Self-pay

## 2023-11-12 MED ORDER — SEMAGLUTIDE(0.25 OR 0.5MG/DOS) 2 MG/3ML ~~LOC~~ SOPN
0.5000 mg | PEN_INJECTOR | SUBCUTANEOUS | 1 refills | Status: AC
  Filled 2023-11-12: qty 3, 28d supply, fill #0
  Filled 2024-01-02: qty 9, 84d supply, fill #0
  Filled 2024-01-30: qty 3, 28d supply, fill #1
  Filled 2024-03-26: qty 3, 28d supply, fill #2
  Filled 2024-04-20: qty 3, 28d supply, fill #3

## 2023-11-15 ENCOUNTER — Other Ambulatory Visit (HOSPITAL_COMMUNITY): Payer: Self-pay

## 2023-11-23 ENCOUNTER — Other Ambulatory Visit (HOSPITAL_COMMUNITY): Payer: Self-pay

## 2023-11-25 ENCOUNTER — Other Ambulatory Visit (HOSPITAL_COMMUNITY): Payer: Self-pay

## 2023-12-14 ENCOUNTER — Ambulatory Visit: Payer: 59 | Admitting: Neurology

## 2024-01-03 ENCOUNTER — Other Ambulatory Visit (HOSPITAL_COMMUNITY): Payer: Self-pay

## 2024-01-03 ENCOUNTER — Other Ambulatory Visit: Payer: Self-pay

## 2024-01-04 ENCOUNTER — Other Ambulatory Visit: Payer: Self-pay

## 2024-01-04 ENCOUNTER — Other Ambulatory Visit (HOSPITAL_COMMUNITY): Payer: Self-pay

## 2024-01-04 ENCOUNTER — Encounter: Payer: Self-pay | Admitting: Pharmacist

## 2024-01-04 MED ORDER — FREESTYLE LIBRE 3 SENSOR MISC
1.0000 | 1 refills | Status: DC
  Filled 2024-01-04: qty 6, 84d supply, fill #0

## 2024-01-05 ENCOUNTER — Other Ambulatory Visit (HOSPITAL_COMMUNITY): Payer: Self-pay

## 2024-01-06 ENCOUNTER — Encounter: Payer: Self-pay | Admitting: Pharmacist

## 2024-01-06 ENCOUNTER — Other Ambulatory Visit (HOSPITAL_COMMUNITY): Payer: Self-pay

## 2024-01-06 ENCOUNTER — Other Ambulatory Visit: Payer: Self-pay

## 2024-01-06 MED ORDER — FREESTYLE LIBRE 3 PLUS SENSOR MISC
Freq: Four times a day (QID) | 1 refills | Status: DC
  Filled 2024-01-06: qty 6, 90d supply, fill #0
  Filled 2024-03-26: qty 6, 90d supply, fill #1

## 2024-01-07 ENCOUNTER — Other Ambulatory Visit (HOSPITAL_COMMUNITY): Payer: Self-pay

## 2024-01-07 ENCOUNTER — Other Ambulatory Visit: Payer: Self-pay

## 2024-01-31 ENCOUNTER — Other Ambulatory Visit: Payer: Self-pay

## 2024-01-31 ENCOUNTER — Other Ambulatory Visit (HOSPITAL_COMMUNITY): Payer: Self-pay

## 2024-03-25 ENCOUNTER — Encounter (HOSPITAL_COMMUNITY): Payer: Self-pay | Admitting: *Deleted

## 2024-03-25 ENCOUNTER — Other Ambulatory Visit: Payer: Self-pay

## 2024-03-25 ENCOUNTER — Emergency Department (HOSPITAL_COMMUNITY)

## 2024-03-25 ENCOUNTER — Emergency Department (HOSPITAL_COMMUNITY)
Admission: EM | Admit: 2024-03-25 | Discharge: 2024-03-25 | Disposition: A | Discharge status: Home / Self Care | Attending: Emergency Medicine | Admitting: Emergency Medicine

## 2024-03-25 DIAGNOSIS — J209 Acute bronchitis, unspecified: Secondary | ICD-10-CM

## 2024-03-25 DIAGNOSIS — E119 Type 2 diabetes mellitus without complications: Secondary | ICD-10-CM | POA: Diagnosis not present

## 2024-03-25 DIAGNOSIS — R059 Cough, unspecified: Secondary | ICD-10-CM | POA: Diagnosis present

## 2024-03-25 DIAGNOSIS — R0789 Other chest pain: Secondary | ICD-10-CM | POA: Insufficient documentation

## 2024-03-25 DIAGNOSIS — J45909 Unspecified asthma, uncomplicated: Secondary | ICD-10-CM | POA: Diagnosis not present

## 2024-03-25 LAB — COMPREHENSIVE METABOLIC PANEL WITH GFR
ALT: 12 U/L (ref 0–44)
AST: 20 U/L (ref 15–41)
Albumin: 3.9 g/dL (ref 3.5–5.0)
Alkaline Phosphatase: 76 U/L (ref 38–126)
Anion gap: 8 (ref 5–15)
BUN: 10 mg/dL (ref 6–20)
CO2: 25 mmol/L (ref 22–32)
Calcium: 9.8 mg/dL (ref 8.9–10.3)
Chloride: 103 mmol/L (ref 98–111)
Creatinine, Ser: 0.93 mg/dL (ref 0.44–1.00)
GFR, Estimated: >60 mL/min (ref >60)
Glucose, Bld: 135 mg/dL — ABNORMAL HIGH (ref 70–99)
Potassium: 4.4 mmol/L (ref 3.5–5.1)
Sodium: 136 mmol/L (ref 135–145)
Total Bilirubin: 0.4 mg/dL (ref 0.0–1.2)
Total Protein: 6.9 g/dL (ref 6.5–8.1)

## 2024-03-25 LAB — I-STAT CHEM 8, ED
BUN: 11 mg/dL (ref 6–20)
Calcium, Ion: 1.22 mmol/L (ref 1.15–1.40)
Chloride: 101 mmol/L (ref 98–111)
Creatinine, Ser: 1 mg/dL (ref 0.44–1.00)
Glucose, Bld: 135 mg/dL — ABNORMAL HIGH (ref 70–99)
HCT: 37 % (ref 36.0–46.0)
Hemoglobin: 12.6 g/dL (ref 12.0–15.0)
Potassium: 4.1 mmol/L (ref 3.5–5.1)
Sodium: 137 mmol/L (ref 135–145)
TCO2: 23 mmol/L (ref 22–32)

## 2024-03-25 LAB — URINALYSIS, ROUTINE W REFLEX MICROSCOPIC
Bilirubin Urine: NEGATIVE
Glucose, UA: NEGATIVE mg/dL
Hgb urine dipstick: NEGATIVE
Ketones, ur: NEGATIVE mg/dL
Leukocytes,Ua: NEGATIVE
Nitrite: NEGATIVE
Protein, ur: NEGATIVE mg/dL
Specific Gravity, Urine: 1.025 (ref 1.005–1.030)
pH: 6 (ref 5.0–8.0)

## 2024-03-25 LAB — CBC
HCT: 37.2 % (ref 36.0–46.0)
Hemoglobin: 11.6 g/dL — ABNORMAL LOW (ref 12.0–15.0)
MCH: 27.5 pg (ref 26.0–34.0)
MCHC: 31.2 g/dL (ref 30.0–36.0)
MCV: 88.2 fL (ref 80.0–100.0)
Platelets: 360 K/uL (ref 150–400)
RBC: 4.22 MIL/uL (ref 3.87–5.11)
RDW: 14.3 % (ref 11.5–15.5)
WBC: 7.7 K/uL (ref 4.0–10.5)
nRBC: 0 % (ref 0.0–0.2)

## 2024-03-25 LAB — D-DIMER, QUANTITATIVE: D-Dimer, Quant: <0.27 ug{FEU}/mL (ref 0.00–0.50)

## 2024-03-25 LAB — HCG, SERUM, QUALITATIVE: Preg, Serum: NEGATIVE

## 2024-03-25 LAB — RESP PANEL BY RT-PCR (RSV, FLU A&B, COVID)  RVPGX2
Influenza A by PCR: NEGATIVE
Influenza B by PCR: NEGATIVE
Resp Syncytial Virus by PCR: NEGATIVE
SARS Coronavirus 2 by RT PCR: NEGATIVE

## 2024-03-25 LAB — LIPASE, BLOOD: Lipase: 35 U/L (ref 11–51)

## 2024-03-25 MED ORDER — IPRATROPIUM-ALBUTEROL 0.5-2.5 (3) MG/3ML IN SOLN
3.0000 mL | Freq: Once | RESPIRATORY_TRACT | Status: AC
  Administered 2024-03-25: 3 mL via RESPIRATORY_TRACT
  Filled 2024-03-25: qty 3

## 2024-03-25 MED ORDER — LIDOCAINE HCL (PF) 2% IJ FOR NEBU
5.0000 mL | Freq: Once | RESPIRATORY_TRACT | Status: AC
  Administered 2024-03-25: 5 mL via RESPIRATORY_TRACT
  Filled 2024-03-25: qty 5

## 2024-03-25 MED ORDER — ALBUTEROL SULFATE HFA 108 (90 BASE) MCG/ACT IN AERS
2.0000 | INHALATION_SPRAY | Freq: Once | RESPIRATORY_TRACT | Status: AC
  Administered 2024-03-25: 2 via RESPIRATORY_TRACT
  Filled 2024-03-25: qty 6.7

## 2024-03-25 MED ORDER — MAGNESIUM SULFATE 2 GM/50ML IV SOLN
2.0000 g | Freq: Once | INTRAVENOUS | Status: AC
  Administered 2024-03-25: 2 g via INTRAVENOUS
  Filled 2024-03-25: qty 50

## 2024-03-25 MED ORDER — SODIUM CHLORIDE 0.9 % IV BOLUS
1000.0000 mL | Freq: Once | INTRAVENOUS | Status: AC
  Administered 2024-03-25: 1000 mL via INTRAVENOUS

## 2024-03-25 MED ORDER — AEROCHAMBER PLUS FLO-VU MEDIUM MISC
1.0000 | Freq: Once | Status: AC
  Administered 2024-03-25: 1

## 2024-03-25 MED ORDER — BUDESONIDE-FORMOTEROL FUMARATE 160-4.5 MCG/ACT IN AERO
2.0000 | INHALATION_SPRAY | Freq: Two times a day (BID) | RESPIRATORY_TRACT | 2 refills | Status: AC
  Filled 2024-03-25 – 2024-03-27 (×2): qty 10.2, 30d supply, fill #0

## 2024-03-25 NOTE — ED Triage Notes (Signed)
 Patient states that her right lung feels congested, tested for covid Thursday and it was negative. Patient endorses shob sometimes when she walks.

## 2024-03-25 NOTE — Discharge Instructions (Addendum)
 Take your inhaler 2 to 4 puffs every 4-6 hours for coughing and wheezing.  Use the Pulmicort  as directed daily be sure to wash out your mouth after use with water to avoid thrush.  Get help right away if you: Cough up blood. Feel pain in your chest. Have severe shortness of breath. Faint or keep feeling like you are going to faint. Have a severe headache. Have a fever or chills that get worse. These symptoms may represent a serious problem that is an emergency. Do not wait to see if the symptoms will go away. Get medical help right away. Call your local emergency services (911 in the [place] Do not drive yourself to the hospital.

## 2024-03-25 NOTE — ED Notes (Signed)
 Pt provided discharge instructions and prescription information. Pt was given the opportunity to ask questions and questions were answered.

## 2024-03-25 NOTE — ED Provider Notes (Signed)
 [facility] Provider Note   CSN: [id number] Arrival date & time: 03/25/24  1559     Patient presents with: Cough   Deborah Chavez is a 48 y.o. female with a past medical history of diabetes, asthma who presents emergency department chief complaint of right sided chest discomfort.  Patient reports that she developed cough and URI symptoms4 days ago.  She took an at home COVID test that was negative.  Since that time she has been having discomfort in the right posterior back bilateral shoulders and anterior chest on the right side.  She states that she feels like she cannot get a full breath on the right side.  She has associated cough and nasal congestion without fever.  She denies nausea or vomiting.  {Add pertinent medical, surgical, social history, OB history to HPI:32947}  Cough      Prior to Admission medications   Medication Sig Start Date End Date Taking? Authorizing Provider  Accu-Chek FastClix Lancets MISC use to check blood sugar 2 times daily as instructed 12/24/22     ACCU-CHEK GUIDE test strip Use to check blood sugar 2 times daily as instructed 12/24/22     albuterol  (PROVENTIL  HFA;VENTOLIN  HFA) 108 (90 BASE) MCG/ACT inhaler Inhale 2 puffs into the lungs every 6 (six) hours as needed.    [provider]  Blood Glucose Monitoring Suppl (TGT BLOOD GLUCOSE MONITORING) w/Device KIT Use as directed to check blood sugar. 01/11/23     budesonide -formoterol  (SYMBICORT ) 160-4.5 MCG/ACT inhaler Inhale 2 puffs into the lungs 2 (two) times daily. 01/17/21   [provider]  Continuous Blood Gluc Receiver (FREESTYLE LIBRE 2 READER) DEVI Scan as needed for continuous glucose monitoring. 02/14/21     Continuous Blood Gluc Sensor (FREESTYLE LIBRE 2 SENSOR) MISC APPLY ONE SENSOR TO CLEAN NONDOMINATE ARM EVERY 14 DAYS 09/25/20   Benjamine Aland, MD  Continuous Blood Gluc Sensor (FREESTYLE LIBRE 2 SENSOR) MISC Apply 1 sensor to clean nondominate  arm every 14 days 03/03/22     Continuous Glucose Sensor (FREESTYLE LIBRE 3 PLUS SENSOR) MISC Use to monitor blood sugar before meals, every 2 hours after meals, in the morning and at bedtime. 01/06/24     Continuous Glucose Sensor (FREESTYLE LIBRE 3 SENSOR) MISC Check glucose as directed (before meals, 2 hours after meals, every morning, and at bedtime) 12/17/22   Benjamine Aland, MD  ergocalciferol  (VITAMIN D2) 1.25 MG (50000 UT) capsule Take 1 capsule (50,000 Units total) by mouth once a week. 05/24/23   Benjamine Aland, MD  Glucagon  (GVOKE HYPOPEN  2-PACK) 1 MG/0.2ML SOAJ USE AS DIRECTED WHEN NEEDED IF BLOOD SUGAR DROPS TO 54 OR LOWER 12/17/22   Benjamine Aland, MD  Glucagon  (GVOKE HYPOPEN  2-PACK) 1 MG/0.2ML SOAJ Use as directed when needed if blood sugar drops to 54 or lower 12/17/22   Benjamine Aland, MD  ibuprofen  (ADVIL ) 600 MG tablet Take 1 tablet (600 mg total) by mouth at bedtime for pain 07/30/22     levETIRAcetam  (KEPPRA ) 1000 MG tablet Take 1 tablet (1,000 mg total) by mouth 2 (two) times daily. 08/24/23 08/18/24  Camara, Amadou, MD  linaclotide  (LINZESS ) 72 MCG capsule Take 1 capsule (72 mcg total) by mouth in the morning on an empty stomach with no food for 30 minutes. 10/15/23     montelukast (SINGULAIR) 10 MG tablet Take 10 mg by mouth at bedtime.    [provider]  Semaglutide ,0.25 or 0.5MG /DOS, (OZEMPIC , 0.25 OR 0.5 MG/DOSE,) 2  MG/3ML SOPN Inject 0.5 mg into the skin once a week. 11/12/23     SUMAtriptan  (IMITREX ) 50 MG tablet Take 1 tablet (50 mg total) by mouth as needed for migraine. May repeat in 2 hours if headache persists or recurs. 08/24/23   Camara, Amadou, MD  Norethindrone  Acetate-Ethinyl Estrad-FE (BLISOVI  24 FE) 1-20 MG-MCG(24) tablet Take 1 tablet by mouth daily. 12/11/20 02/01/21      Allergies: Aspirin, Lactose, Lactose intolerance (gi), Mobic  [meloxicam ], Tramadol hcl, Ultram [tramadol hcl], Vioxx [rofecoxib], Other, and Sulfa antibiotics    Review of Systems  Respiratory:   Positive for cough.     Updated Vital Signs BP (!) 152/96 (BP Location: Right Arm)   Pulse (!) 118   Temp 98.3 F (36.8 C)   Resp 18   Ht 5' 4 (1.626 m)   Wt 71.9 kg   LMP 03/07/2024   SpO2 100%   BMI 27.21 kg/m   Physical Exam Vitals and nursing note reviewed.  Constitutional:      General: She is not in acute distress.    Appearance: She is well-developed. She is not diaphoretic.  HENT:     Head: Normocephalic and atraumatic.     Right Ear: External ear normal.     Left Ear: External ear normal.     Nose: Nose normal.     Mouth/Throat:     Mouth: Mucous membranes are moist.  Eyes:     General: No scleral icterus.    Conjunctiva/sclera: Conjunctivae normal.  Cardiovascular:     Rate and Rhythm: Normal rate and regular rhythm.     Heart sounds: Normal heart sounds. No murmur heard.    No friction rub. No gallop.  Pulmonary:     Effort: No respiratory distress.     Breath sounds: Normal breath sounds.     Comments: Tight, bronchitic cough without overt wheezes, normal air movement. Abdominal:     General: Bowel sounds are normal. There is no distension.     Palpations: Abdomen is soft. There is no mass.     Tenderness: There is no abdominal tenderness. There is no guarding.  Musculoskeletal:     Cervical back: Normal range of motion.  Skin:    General: Skin is warm and dry.  Neurological:     Mental Status: She is alert and oriented to person, place, and time.  Psychiatric:        Behavior: Behavior normal.     (all labs ordered are listed, but only abnormal results are displayed) Labs Reviewed  COMPREHENSIVE METABOLIC PANEL WITH GFR - Abnormal; Notable for the following components:      Result Value   Glucose, Bld 135 (*)    All other components within normal limits  CBC - Abnormal; Notable for the following components:   Hemoglobin 11.6 (*)    All other components within normal limits  URINALYSIS, ROUTINE W REFLEX MICROSCOPIC - Abnormal; Notable for  the following components:   APPearance HAZY (*)    All other components within normal limits  I-STAT CHEM 8, ED - Abnormal; Notable for the following components:   Glucose, Bld 135 (*)    All other components within normal limits  RESP PANEL BY RT-PCR (RSV, FLU A&B, COVID)  RVPGX2  LIPASE, BLOOD  HCG, SERUM, QUALITATIVE    EKG: EKG Interpretation Date/Time:  Saturday March 25 2024 16:29:37 EDT Ventricular Rate:  102 PR Interval:  156 QRS Duration:  70 QT Interval:  310 QTC Calculation: 404 R  Axis:   32  Text Interpretation: Sinus tachycardia Cannot rule out Anterior infarct , age undetermined Abnormal ECG When compared with ECG of 12-May-2022 12:09, Since last tracing rate faster Confirmed by Randol Simmonds [phone]) on 03/25/2024 4:45:37 PM  Radiology: [facility] Chest 2 View Result Date: 03/25/2024 CLINICAL DATA:  Cough, shortness of breath. EXAM: CHEST - 2 VIEW COMPARISON:  January 21, 2021. FINDINGS: The heart size and mediastinal contours are within normal limits. Both lungs are clear. The visualized skeletal structures are unremarkable. IMPRESSION: No active cardiopulmonary disease. Electronically Signed   By: Lynwood Landy Raddle M.D.   On: 03/25/2024 17:14    {Document cardiac monitor, telemetry assessment procedure when appropriate:32947} Procedures   Medications Ordered in the ED  magnesium  sulfate IVPB 2 g 50 mL (2 g Intravenous New Bag/Given 03/25/24 1822)  ipratropium-albuterol  (DUONEB) 0.5-2.5 (3) MG/3ML nebulizer solution 3 mL (3 mLs Nebulization Given 03/25/24 1822)  sodium chloride  0.9 % bolus 1,000 mL (1,000 mLs Intravenous New Bag/Given 03/25/24 1819)      {Click here for ABCD2, HEART and other calculators REFRESH Note before signing:1}                              Medical Decision Making Amount and/or Complexity of Data Reviewed Labs: ordered. Radiology: ordered.  Risk Prescription drug management.   This patient presents to the ED with chief complaint(s) of cough shortness  of breath and right shoulder pain with pertinent past medical history of diabetes, asthma which further complicates the presenting complaint. The complaint involves an extensive differential diagnosis and treatment options and also carries with it a high risk of complications and morbidity.    The differential diagnosis includes most likely diagnosis is bronchospasm from URI.  Further patient is consider pulmonary embolus or biliary colic.  I have also considered pneumonia pneumothorax.  No suspicion for ACS at this time.  Initial plan is to treat for asthma.  I have also ordered right upper quadrant ultrasound and a D-dimer.  Additional Tests and treatment considered: Patient is low risk / negative by ***, therefore do not feel that *** is indicated.  Additional history obtained: Additional history obtained from {additional history:26846} Records reviewed {records:26847}  Reassessment and review (also see workup area): Lab Tests: I Ordered, and personally interpreted labs.  The pertinent results include:  ***  Imaging Studies: I ordered and independently visualized and interpreted the following imaging {imaging:26848}  which showed *** The interpretation of the imaging was limited to assessing for emergent pathology, for which purpose it was ordered.  Consultations Obtained: I requested consultation with the {consultation:26851}, and discussed  findings as well as pertinent plan - they recommend: ***  Medicines ordered and prescription drug management: I ordered the following medications *** for ***  I considered this additional medications: ***   Reevaluation of the patient after these medicines showed that the patient    {resolved/improved/worsened:23923::improved}  Social Determinants of Health: Patient's {DINY:73154}  increases the complexity of managing their presentation  Cardiac Monitoring: The patient was maintained on a cardiac monitor.  I personally viewed and  interpreted the cardiac monitor which showed an underlying rhythm of:  {cardiac monitor:26849}  Complexity of problems addressed: Patient's presentation is most consistent with  {COPA:26843} During patient's assessment  Disposition: After consideration of the diagnostic results and the patient's response to treatment,  I feel that the patent would benefit from {disposition:26850}.    {Document critical care time when  appropriate  Document review of labs and clinical decision tools ie CHADS2VASC2, etc  Document your independent review of radiology images and any outside records  Document your discussion with family members, caretakers and with consultants  Document social determinants of health affecting pt's care  Document your decision making why or why not admission, treatments were needed:32947:::1}   Final diagnoses:  None    ED Discharge Orders     None

## 2024-03-26 ENCOUNTER — Other Ambulatory Visit (HOSPITAL_COMMUNITY): Payer: Self-pay

## 2024-03-27 ENCOUNTER — Other Ambulatory Visit (HOSPITAL_COMMUNITY): Payer: Self-pay

## 2024-03-27 ENCOUNTER — Other Ambulatory Visit: Payer: Self-pay

## 2024-04-19 ENCOUNTER — Encounter (HOSPITAL_BASED_OUTPATIENT_CLINIC_OR_DEPARTMENT_OTHER): Payer: Self-pay | Admitting: Urology

## 2024-04-19 ENCOUNTER — Emergency Department (HOSPITAL_BASED_OUTPATIENT_CLINIC_OR_DEPARTMENT_OTHER)
Admission: EM | Admit: 2024-04-19 | Discharge: 2024-04-19 | Disposition: A | Discharge status: Home / Self Care | Attending: Emergency Medicine | Admitting: Emergency Medicine

## 2024-04-19 ENCOUNTER — Other Ambulatory Visit: Payer: Self-pay

## 2024-04-19 DIAGNOSIS — R300 Dysuria: Secondary | ICD-10-CM | POA: Diagnosis present

## 2024-04-19 DIAGNOSIS — Z7984 Long term (current) use of oral hypoglycemic drugs: Secondary | ICD-10-CM | POA: Diagnosis not present

## 2024-04-19 DIAGNOSIS — E119 Type 2 diabetes mellitus without complications: Secondary | ICD-10-CM | POA: Insufficient documentation

## 2024-04-19 DIAGNOSIS — J45909 Unspecified asthma, uncomplicated: Secondary | ICD-10-CM | POA: Diagnosis not present

## 2024-04-19 DIAGNOSIS — N3000 Acute cystitis without hematuria: Secondary | ICD-10-CM | POA: Insufficient documentation

## 2024-04-19 LAB — URINALYSIS, ROUTINE W REFLEX MICROSCOPIC
Bilirubin Urine: NEGATIVE
Glucose, UA: NEGATIVE mg/dL
Ketones, ur: NEGATIVE mg/dL
Nitrite: NEGATIVE
Protein, ur: NEGATIVE mg/dL
Specific Gravity, Urine: >=1.03 (ref 1.005–1.030)
pH: 6 (ref 5.0–8.0)

## 2024-04-19 LAB — URINALYSIS, MICROSCOPIC (REFLEX): WBC, UA: >50 WBC/hpf (ref 0–5)

## 2024-04-19 LAB — CBG MONITORING, ED: Glucose-Capillary: 127 mg/dL — ABNORMAL HIGH (ref 70–99)

## 2024-04-19 MED ORDER — CEPHALEXIN 250 MG PO CAPS
500.0000 mg | ORAL_CAPSULE | Freq: Once | ORAL | Status: AC
  Administered 2024-04-19: 500 mg via ORAL
  Filled 2024-04-19: qty 2

## 2024-04-19 MED ORDER — CEPHALEXIN 500 MG PO CAPS: 500.0000 mg | ORAL_CAPSULE | Freq: Four times a day (QID) | ORAL | 0 refills | Status: DC

## 2024-04-19 NOTE — ED Triage Notes (Signed)
 Pt states concern for UTI, blood urine and burning with urination since this am at 0430.  Also states frequency and urgency

## 2024-04-19 NOTE — ED Provider Notes (Addendum)
 [facility] Provider Note   CSN: [id number] Arrival date & time: 04/19/24  2156     Patient presents with: Dysuria   Deborah Chavez is a 48 y.o. female.   Patient with the complaint of probable urinary tract infection urinary frequency burning and saw some blood in the urine also urgency.  Started this morning at about 0 430.  Patient's had a tubal ligation no concerns about pregnancy.  Patient is allergic to sulfur antibiotics.  Patient denies any fever chills denies any CVA tenderness or pain.  Past medical history sniffer asthma lower urinary tract infections, diabetes without complications.  Past surgical history is noted for tubal ligation.  Patient is never used tobacco products.       Prior to Admission medications   Medication Sig Start Date End Date Taking? Authorizing Provider  Accu-Chek FastClix Lancets MISC use to check blood sugar 2 times daily as instructed 12/24/22     ACCU-CHEK GUIDE test strip Use to check blood sugar 2 times daily as instructed 12/24/22     albuterol  (PROVENTIL  HFA;VENTOLIN  HFA) 108 (90 BASE) MCG/ACT inhaler Inhale 2 puffs into the lungs every 6 (six) hours as needed.    [provider]  Blood Glucose Monitoring Suppl (TGT BLOOD GLUCOSE MONITORING) w/Device KIT Use as directed to check blood sugar. 01/11/23     budesonide -formoterol  (SYMBICORT ) 160-4.5 MCG/ACT inhaler Inhale 2 puffs into the lungs 2 (two) times daily. 03/25/24   Harris, Abigail, PA-C  Continuous Blood Gluc Receiver (FREESTYLE LIBRE 2 READER) DEVI Scan as needed for continuous glucose monitoring. 02/14/21     Continuous Blood Gluc Sensor (FREESTYLE LIBRE 2 SENSOR) MISC APPLY ONE SENSOR TO CLEAN NONDOMINATE ARM EVERY 14 DAYS 09/25/20   Benjamine Aland, MD  Continuous Blood Gluc Sensor (FREESTYLE LIBRE 2 SENSOR) MISC Apply 1 sensor to clean nondominate arm every 14 days 03/03/22     Continuous Glucose Sensor (FREESTYLE LIBRE 3 PLUS SENSOR) MISC  Use to monitor blood sugar before meals, every 2 hours after meals, in the morning and at bedtime. 01/06/24     Continuous Glucose Sensor (FREESTYLE LIBRE 3 SENSOR) MISC Check glucose as directed (before meals, 2 hours after meals, every morning, and at bedtime) 12/17/22   Benjamine Aland, MD  ergocalciferol  (VITAMIN D2) 1.25 MG (50000 UT) capsule Take 1 capsule (50,000 Units total) by mouth once a week. 05/24/23   Benjamine Aland, MD  Glucagon  (GVOKE HYPOPEN  2-PACK) 1 MG/0.2ML SOAJ USE AS DIRECTED WHEN NEEDED IF BLOOD SUGAR DROPS TO 54 OR LOWER 12/17/22   Benjamine Aland, MD  Glucagon  (GVOKE HYPOPEN  2-PACK) 1 MG/0.2ML SOAJ Use as directed when needed if blood sugar drops to 54 or lower 12/17/22   Benjamine Aland, MD  ibuprofen  (ADVIL ) 600 MG tablet Take 1 tablet (600 mg total) by mouth at bedtime for pain 07/30/22     levETIRAcetam  (KEPPRA ) 1000 MG tablet Take 1 tablet (1,000 mg total) by mouth 2 (two) times daily. 08/24/23 08/18/24  Camara, Amadou, MD  linaclotide  (LINZESS ) 72 MCG capsule Take 1 capsule (72 mcg total) by mouth in the morning on an empty stomach with no food for 30 minutes. 10/15/23     montelukast (SINGULAIR) 10 MG tablet Take 10 mg by mouth at bedtime.    [provider]  Semaglutide ,0.25 or 0.5MG /DOS, (OZEMPIC , 0.25 OR 0.5 MG/DOSE,) 2 MG/3ML SOPN Inject 0.5 mg into the skin once a week. 11/12/23     SUMAtriptan  (IMITREX ) 50 MG tablet  Take 1 tablet (50 mg total) by mouth as needed for migraine. May repeat in 2 hours if headache persists or recurs. 08/24/23   Camara, Amadou, MD  Norethindrone  Acetate-Ethinyl Estrad-FE (BLISOVI  24 FE) 1-20 MG-MCG(24) tablet Take 1 tablet by mouth daily. 12/11/20 02/01/21      Allergies: Aspirin, Lactose, Lactose intolerance (gi), Mobic  [meloxicam ], Tramadol hcl, Ultram [tramadol hcl], Vioxx [rofecoxib], Other, and Sulfa antibiotics    Review of Systems  Constitutional:  Negative for chills and fever.  HENT:  Negative for ear pain and sore throat.   Eyes:  Negative  for pain and visual disturbance.  Respiratory:  Negative for cough and shortness of breath.   Cardiovascular:  Negative for chest pain and palpitations.  Gastrointestinal:  Negative for abdominal pain, nausea and vomiting.  Genitourinary:  Positive for dysuria, frequency, hematuria and urgency.  Musculoskeletal:  Negative for arthralgias and back pain.  Skin:  Negative for color change and rash.  Neurological:  Negative for seizures and syncope.  All other systems reviewed and are negative.   Updated Vital Signs BP 128/79 (BP Location: Left Arm)   Pulse 89   Temp 98.4 F (36.9 C) (Oral)   Resp 16   Ht 1.626 m (5' 4)   Wt 71.9 kg   LMP 03/07/2024   SpO2 100%   BMI 27.21 kg/m   Physical Exam Vitals and nursing note reviewed.  Constitutional:      General: She is not in acute distress.    Appearance: Normal appearance. She is well-developed. She is not ill-appearing.  HENT:     Head: Normocephalic and atraumatic.     Mouth/Throat:     Mouth: Mucous membranes are moist.  Eyes:     Conjunctiva/sclera: Conjunctivae normal.  Cardiovascular:     Rate and Rhythm: Normal rate and regular rhythm.     Heart sounds: No murmur heard. Pulmonary:     Effort: Pulmonary effort is normal. No respiratory distress.     Breath sounds: Normal breath sounds. No wheezing or rhonchi.  Abdominal:     General: There is no distension.     Palpations: Abdomen is soft.     Tenderness: There is no abdominal tenderness. There is no guarding.  Musculoskeletal:        General: No swelling.     Cervical back: Neck supple.  Skin:    General: Skin is warm and dry.     Capillary Refill: Capillary refill takes less than 2 seconds.  Neurological:     General: No focal deficit present.     Mental Status: She is alert and oriented to person, place, and time.  Psychiatric:        Mood and Affect: Mood normal.     (all labs ordered are listed, but only abnormal results are displayed) Labs Reviewed   URINALYSIS, ROUTINE W REFLEX MICROSCOPIC - Abnormal; Notable for the following components:      Result Value   APPearance CLOUDY (*)    Hgb urine dipstick SMALL (*)    Leukocytes,Ua TRACE (*)    All other components within normal limits  URINALYSIS, MICROSCOPIC (REFLEX) - Abnormal; Notable for the following components:   Bacteria, UA MANY (*)    All other components within normal limits  CBG MONITORING, ED - Abnormal; Notable for the following components:   Glucose-Capillary 127 (*)    All other components within normal limits  URINE CULTURE    EKG: None  Radiology: No results found.   Procedures  Medications Ordered in the ED  cephALEXin (KEFLEX) capsule 500 mg (has no administration in time range)                                    Medical Decision Making Amount and/or Complexity of Data Reviewed Labs: ordered.   Patient nontoxic no acute distress.  Abdomen soft nontender.  No CVA tenderness.  Clinically sounds like urinary tract infection without status symptoms about 430 this morning.  Will check blood sugar since she is diabetic.  And will send urine for urinalysis.  If any concerns for urinary tract infection will send for urine culture plan will be to treat her with Keflex.  If urine is negative.  May need to do additional workup with additional labs and CT renal study.  Fingerstick blood sugar 127 which is reassuring.  Urinalysis still pending.  Urinalysis cloudy.  Microscopic greater than 50 white cells.  Many bacteria.  Only 0-5 red blood cells.  Seems to be consistent with urinary tract infection.  Will give first dose of Keflex here tonight will send urine for culture.  Final diagnoses:  Dysuria  Acute cystitis without hematuria    ED Discharge Orders     None          Geraldene Hamilton, MD 04/19/24 2252    Geraldene Hamilton, MD 04/19/24 2303

## 2024-04-19 NOTE — Discharge Instructions (Addendum)
 Urine has been sent for culture.  Urinalysis seems to be consistent with urinary tract infection.  Take the Keflex as directed.  First dose given here tonight.  Would expect improvement over the next 24 to 48 hours.  Return for any new or worse symptoms like persistent vomiting or fever or chills or back pain.  Follow-up with your doctor to make sure things improved.

## 2024-04-20 ENCOUNTER — Other Ambulatory Visit (HOSPITAL_COMMUNITY): Payer: Self-pay

## 2024-04-20 MED ORDER — FREESTYLE LIBRE 3 PLUS SENSOR MISC
Freq: Four times a day (QID) | 1 refills | Status: AC
  Filled 2024-04-20: qty 6, fill #0
  Filled 2024-04-26: qty 6, 90d supply, fill #0
  Filled 2024-05-16: qty 6, 84d supply, fill #0

## 2024-04-21 ENCOUNTER — Other Ambulatory Visit: Payer: Self-pay

## 2024-04-21 ENCOUNTER — Encounter (HOSPITAL_COMMUNITY): Payer: Self-pay

## 2024-04-21 ENCOUNTER — Ambulatory Visit (HOSPITAL_COMMUNITY)
Admission: EM | Admit: 2024-04-21 | Discharge: 2024-04-21 | Disposition: A | Discharge status: Home / Self Care | Attending: Physician Assistant | Admitting: Physician Assistant

## 2024-04-21 ENCOUNTER — Other Ambulatory Visit (HOSPITAL_COMMUNITY): Payer: Self-pay

## 2024-04-21 DIAGNOSIS — Z3202 Encounter for pregnancy test, result negative: Secondary | ICD-10-CM

## 2024-04-21 DIAGNOSIS — R102 Pelvic and perineal pain unspecified side: Secondary | ICD-10-CM | POA: Insufficient documentation

## 2024-04-21 DIAGNOSIS — N730 Acute parametritis and pelvic cellulitis: Secondary | ICD-10-CM | POA: Insufficient documentation

## 2024-04-21 DIAGNOSIS — R1030 Lower abdominal pain, unspecified: Secondary | ICD-10-CM | POA: Diagnosis not present

## 2024-04-21 LAB — COMPREHENSIVE METABOLIC PANEL WITH GFR
ALT: 16 U/L (ref 0–44)
AST: 19 U/L (ref 15–41)
Albumin: 3.9 g/dL (ref 3.5–5.0)
Alkaline Phosphatase: 67 U/L (ref 38–126)
Anion gap: 7 (ref 5–15)
BUN: 17 mg/dL (ref 6–20)
CO2: 24 mmol/L (ref 22–32)
Calcium: 9.5 mg/dL (ref 8.9–10.3)
Chloride: 105 mmol/L (ref 98–111)
Creatinine, Ser: 0.79 mg/dL (ref 0.44–1.00)
GFR, Estimated: >60 mL/min (ref >60)
Glucose, Bld: 162 mg/dL — ABNORMAL HIGH (ref 70–99)
Potassium: 4.7 mmol/L (ref 3.5–5.1)
Sodium: 136 mmol/L (ref 135–145)
Total Bilirubin: 0.4 mg/dL (ref 0.0–1.2)
Total Protein: 7.1 g/dL (ref 6.5–8.1)

## 2024-04-21 LAB — URINE CULTURE

## 2024-04-21 LAB — CBC WITH DIFFERENTIAL/PLATELET
Abs Immature Granulocytes: 0.02 K/uL (ref 0.00–0.07)
Basophils Absolute: 0.1 K/uL (ref 0.0–0.1)
Basophils Relative: 1 %
Eosinophils Absolute: 0.4 K/uL (ref 0.0–0.5)
Eosinophils Relative: 5 %
HCT: 34.9 % — ABNORMAL LOW (ref 36.0–46.0)
Hemoglobin: 11 g/dL — ABNORMAL LOW (ref 12.0–15.0)
Immature Granulocytes: 0 %
Lymphocytes Relative: 43 %
Lymphs Abs: 3 K/uL (ref 0.7–4.0)
MCH: 27.3 pg (ref 26.0–34.0)
MCHC: 31.5 g/dL (ref 30.0–36.0)
MCV: 86.6 fL (ref 80.0–100.0)
Monocytes Absolute: 0.7 K/uL (ref 0.1–1.0)
Monocytes Relative: 10 %
Neutro Abs: 2.9 K/uL (ref 1.7–7.7)
Neutrophils Relative %: 41 %
Platelets: 349 K/uL (ref 150–400)
RBC: 4.03 MIL/uL (ref 3.87–5.11)
RDW: 14 % (ref 11.5–15.5)
WBC: 7.1 K/uL (ref 4.0–10.5)
nRBC: 0 % (ref 0.0–0.2)

## 2024-04-21 LAB — POCT URINALYSIS DIP (MANUAL ENTRY)
Bilirubin, UA: NEGATIVE
Glucose, UA: NEGATIVE mg/dL
Ketones, POC UA: NEGATIVE mg/dL
Leukocytes, UA: NEGATIVE
Nitrite, UA: NEGATIVE
Protein Ur, POC: NEGATIVE mg/dL
Spec Grav, UA: 1.025 (ref 1.010–1.025)
Urobilinogen, UA: 1 U/dL
pH, UA: 6 (ref 5.0–8.0)

## 2024-04-21 LAB — POCT URINE PREGNANCY: Preg Test, Ur: NEGATIVE

## 2024-04-21 MED ORDER — DOXYCYCLINE HYCLATE 100 MG PO CAPS: 100.0000 mg | ORAL_CAPSULE | Freq: Two times a day (BID) | ORAL | 0 refills | Status: AC

## 2024-04-21 MED ORDER — LIDOCAINE HCL (PF) 1 % IJ SOLN
INTRAMUSCULAR | Status: AC
  Filled 2024-04-21: qty 2

## 2024-04-21 MED ORDER — CEFTRIAXONE SODIUM 500 MG IJ SOLR
500.0000 mg | INTRAMUSCULAR | Status: DC
  Administered 2024-04-21: 500 mg via INTRAMUSCULAR

## 2024-04-21 MED ORDER — CEFTRIAXONE SODIUM 500 MG IJ SOLR
INTRAMUSCULAR | Status: AC
Start: 2024-04-21 — End: 2024-04-21
  Filled 2024-04-21: qty 500

## 2024-04-21 MED ORDER — METRONIDAZOLE 500 MG PO TABS: 500.0000 mg | ORAL_TABLET | Freq: Two times a day (BID) | ORAL | 0 refills | Status: AC

## 2024-04-21 NOTE — Discharge Instructions (Addendum)
 We are treating you for pelvic inflammatory disease.  Start doxycycline 100 mg twice daily and metronidazole 500 mg twice daily for 2 weeks.  Stay out of the sun while on doxycycline as it can cause her to have bad sunburn and avoid alcohol while taking metronidazole as this can cause you to have vomiting.  I will contact you if any of your other testing is abnormal and changes our treatment plan.  Follow up with your OB/GYN as soon as possible.  If your symptoms are not improving within 1 to 2 days or if anything worsens and you have severe pain, fever, nausea/vomiting interfering with oral intake you need to be seen immediately in the emergency room.

## 2024-04-21 NOTE — ED Provider Notes (Signed)
 [facility]    CSN: [id number] Arrival date & time: 04/21/24  1811      History   Chief Complaint Chief Complaint  Patient presents with   Urinary Tract Infection    HPI LEXA CORONADO is a 48 y.o. female.   Patient presents today with a several day history of significant lower abdominal pain and urinary symptoms.  She reports bladder spasms, dysuria, frequency, urgency.  She has noticed a small amount of clear vaginal discharge but assumed this was physiologic.  She has no specific concern for STI but is open to testing as she is not experiencing pelvic pain that wraps around to her lower back.  Denies any flank pain, fever, nausea, vomiting.  She was seen in the emergency room on 04/19/2024 at which point she was diagnosed with a UTI and started on cephalexin.  She reports  taking medication as prescribed without missing doses but she continues to have significant pains and has not had any relief of symptoms.  Urine culture was obtained but grew multiple species requesting recollection prompting her to be reevaluated today.  Denies additional antibiotics in the past 90 days; was prescribed Augmentin for sinusitis but did not end up taking this medication.  She denies history of urogenital procedure, self-catheterization, single kidney, nephrolithiasis.  She does have a history of diabetes but does not take SGLT2 inhibitor.    Past Medical History:  Diagnosis Date   AGUS favor dysplasia    Allergy    Anemia    Asthma    Diabetes mellitus without complication (HCC)    Prediabetes    UTI (lower urinary tract infection)     Patient Active Problem List   Diagnosis Date Noted   AGUS favor dysplasia     Past Surgical History:  Procedure Laterality Date   CESAREAN SECTION     TUBAL LIGATION      OB History   No obstetric history on file.      Home Medications    Prior to Admission medications   Medication Sig Start Date End Date Taking? Authorizing Provider   doxycycline (VIBRAMYCIN) 100 MG capsule Take 1 capsule (100 mg total) by mouth 2 (two) times daily. 04/21/24  Yes Karma Ansley K, PA-C  metroNIDAZOLE (FLAGYL) 500 MG tablet Take 1 tablet (500 mg total) by mouth 2 (two) times daily. 04/21/24  Yes Jabin Tapp, Rocky POUR, PA-C  Accu-Chek FastClix Lancets MISC use to check blood sugar 2 times daily as instructed 12/24/22     ACCU-CHEK GUIDE test strip Use to check blood sugar 2 times daily as instructed 12/24/22     albuterol  (PROVENTIL  HFA;VENTOLIN  HFA) 108 (90 BASE) MCG/ACT inhaler Inhale 2 puffs into the lungs every 6 (six) hours as needed.    [provider]  Blood Glucose Monitoring Suppl (TGT BLOOD GLUCOSE MONITORING) w/Device KIT Use as directed to check blood sugar. 01/11/23     budesonide -formoterol  (SYMBICORT ) 160-4.5 MCG/ACT inhaler Inhale 2 puffs into the lungs 2 (two) times daily. 03/25/24   Harris, Abigail, PA-C  Continuous Blood Gluc Receiver (FREESTYLE LIBRE 2 READER) DEVI Scan as needed for continuous glucose monitoring. 02/14/21     Continuous Blood Gluc Sensor (FREESTYLE LIBRE 2 SENSOR) MISC APPLY ONE SENSOR TO CLEAN NONDOMINATE ARM EVERY 14 DAYS 09/25/20   Benjamine Aland, MD  Continuous Blood Gluc Sensor (FREESTYLE LIBRE 2 SENSOR) MISC Apply 1 sensor to clean nondominate arm every 14 days 03/03/22     Continuous Glucose Sensor (FREESTYLE [place]  3 PLUS SENSOR) MISC Use to monitor blood sugar before meals, every 2 hours after meals, in the morning and at bedtime. 04/20/24     Continuous Glucose Sensor (FREESTYLE LIBRE 3 SENSOR) MISC Check glucose as directed (before meals, 2 hours after meals, every morning, and at bedtime) 12/17/22   Benjamine Aland, MD  ergocalciferol  (VITAMIN D2) 1.25 MG (50000 UT) capsule Take 1 capsule (50,000 Units total) by mouth once a week. 05/24/23   Benjamine Aland, MD  Glucagon  (GVOKE HYPOPEN  2-PACK) 1 MG/0.2ML SOAJ USE AS DIRECTED WHEN NEEDED IF BLOOD SUGAR DROPS TO 54 OR LOWER 12/17/22   Benjamine Aland, MD  Glucagon  (GVOKE HYPOPEN   2-PACK) 1 MG/0.2ML SOAJ Use as directed when needed if blood sugar drops to 54 or lower 12/17/22   Benjamine Aland, MD  ibuprofen  (ADVIL ) 600 MG tablet Take 1 tablet (600 mg total) by mouth at bedtime for pain 07/30/22     levETIRAcetam  (KEPPRA ) 1000 MG tablet Take 1 tablet (1,000 mg total) by mouth 2 (two) times daily. 08/24/23 08/18/24  Camara, Amadou, MD  linaclotide  (LINZESS ) 72 MCG capsule Take 1 capsule (72 mcg total) by mouth in the morning on an empty stomach with no food for 30 minutes. 10/15/23     montelukast (SINGULAIR) 10 MG tablet Take 10 mg by mouth at bedtime.    [provider]  Semaglutide ,0.25 or 0.5MG /DOS, (OZEMPIC , 0.25 OR 0.5 MG/DOSE,) 2 MG/3ML SOPN Inject 0.5 mg into the skin once a week. 11/12/23     SUMAtriptan  (IMITREX ) 50 MG tablet Take 1 tablet (50 mg total) by mouth as needed for migraine. May repeat in 2 hours if headache persists or recurs. 08/24/23   Camara, Amadou, MD  Norethindrone  Acetate-Ethinyl Estrad-FE (BLISOVI  24 FE) 1-20 MG-MCG(24) tablet Take 1 tablet by mouth daily. 12/11/20 02/01/21      Family History Family History  Problem Relation Age of Onset   Diabetes Mother    Hyperlipidemia Mother    Stroke Mother    Diabetes Maternal Grandmother    Cancer Maternal Grandmother    Hypertension Maternal Grandmother    Hyperlipidemia Father    Hypertension Father    Stroke Father    Cancer Sister    Hypertension Sister    Hyperlipidemia Maternal Grandfather    Diabetes Maternal Grandfather    Stroke Maternal Grandfather    Cancer Paternal Grandmother    Diabetes Maternal Aunt    Diabetes Maternal Uncle    Cancer Other    Stroke Other    Hypertension Other     Social History Social History   Tobacco Use   Smoking status: Never   Smokeless tobacco: Never  Substance Use Topics   Alcohol use: No   Drug use: No     Allergies   Aspirin, Lactose, Lactose intolerance (gi), Mobic  [meloxicam ], Tramadol hcl, Ultram [tramadol hcl], Vioxx [rofecoxib],  Other, and Sulfa antibiotics   Review of Systems Review of Systems  Constitutional:  Positive for activity change. Negative for appetite change, fatigue and fever.  Gastrointestinal:  Positive for abdominal pain. Negative for diarrhea, nausea and vomiting.  Genitourinary:  Positive for dysuria, frequency and vaginal discharge. Negative for flank pain, urgency, vaginal bleeding and vaginal pain.  Musculoskeletal:  Positive for back pain. Negative for arthralgias and myalgias.     Physical Exam Triage Vital Signs ED Triage Vitals  Encounter Vitals Group     BP 04/21/24 1834 116/76     Girls Systolic BP Percentile --  Girls Diastolic BP Percentile --      Boys Systolic BP Percentile --      Boys Diastolic BP Percentile --      Pulse Rate 04/21/24 1834 85     Resp 04/21/24 1834 18     Temp 04/21/24 1834 98.7 F (37.1 C)     Temp Source 04/21/24 1834 Oral     SpO2 04/21/24 1834 98 %     Weight --      Height --      Head Circumference --      Peak Flow --      Pain Score 04/21/24 1831 7     Pain Loc --      Pain Education --      Exclude from Growth Chart --    No data found.  Updated Vital Signs BP 116/76 (BP Location: Right Arm)   Pulse 85   Temp 98.7 F (37.1 C) (Oral)   Resp 18   LMP 04/03/2024 (Exact Date)   SpO2 98%   Visual Acuity Right Eye Distance:   Left Eye Distance:   Bilateral Distance:    Right Eye Near:   Left Eye Near:    Bilateral Near:     Physical Exam Vitals reviewed. Exam conducted with a chaperone present.  Constitutional:      General: She is awake. She is not in acute distress.    Appearance: Normal appearance. She is well-developed. She is not ill-appearing.     Comments: Very pleasant female appears stated age in no acute distress sitting up with exam room holding her lower abdomen  HENT:     Head: Normocephalic and atraumatic.  Cardiovascular:     Rate and Rhythm: Normal rate and regular rhythm.     Heart sounds: Normal heart  sounds, S1 normal and S2 normal. No murmur heard. Pulmonary:     Effort: Pulmonary effort is normal.     Breath sounds: Normal breath sounds. No wheezing, rhonchi or rales.     Comments: Clear auscultation bilaterally Abdominal:     General: Bowel sounds are normal.     Palpations: Abdomen is soft.     Tenderness: There is abdominal tenderness in the suprapubic area. There is no right CVA tenderness, left CVA tenderness, guarding or rebound. Negative signs include Rovsing's sign, McBurney's sign, psoas sign and obturator sign.     Comments: Mild tenderness palpation in lower abdomen.  No evidence of acute abdomen on physical exam.  Genitourinary:    Labia:        Right: No rash or tenderness.        Left: No rash or tenderness.      Vagina: Vaginal discharge present.     Cervix: Cervical motion tenderness present.     Uterus: Normal.      Adnexa: Right adnexa normal and left adnexa normal.       Right: No mass or tenderness.         Left: No mass or tenderness.       Comments: Diane, RN present as chaperone during exam.  Mild CMT tenderness and adnexal tenderness on exam.  Discharge noted posterior vaginal vault. Psychiatric:        Behavior: Behavior is cooperative.      UC Treatments / Results  Labs (all labs ordered are listed, but only abnormal results are displayed) Labs Reviewed  POCT URINALYSIS DIP (MANUAL ENTRY) - Abnormal; Notable for the following components:  Result Value   Blood, UA trace-intact (*)    All other components within normal limits  URINE CULTURE  CBC WITH DIFFERENTIAL/PLATELET  COMPREHENSIVE METABOLIC PANEL WITH GFR  MISC [facility] TEST (SEND OUT)  POCT URINE PREGNANCY  CERVICOVAGINAL ANCILLARY ONLY    EKG   Radiology No results found.  Procedures Procedures (including critical care time)  Medications Ordered in UC Medications  cefTRIAXone (ROCEPHIN) injection 500 mg (500 mg Intramuscular Given 04/21/24 1932)    Initial Impression /  Assessment and Plan / UC Course  I have reviewed the triage vital signs and the nursing notes.  Pertinent labs & imaging results that were available during my care of the patient were reviewed by me and considered in my medical decision making (see chart for details).     Patient is well-appearing, afebrile, nontoxic, nontachycardic.  UA was repeated and showed trace blood but was otherwise normal.  Urine pregnancy was negative.  She did have some pelvic discomfort on bimanual exam so we will cover for PID particularly if she has not had any improvement with appropriate antibiotics.  We will send her urine for culture and contact her if this is abnormal and changes her treatment plan.  She was given Rocephin in clinic and started on doxycycline as well as metronidazole.  Discussed that she is to avoid alcohol as well as prolonged sun exposure while on these medications due to potential side effects.  Basic blood work including CBC and CMP were obtained if she has significant leukocytosis or abnormal kidney function we will contact her and have her go to the emergency room.  I did recommend that she follow-up with OB/GYN as soon as possible as we do not have pelvic ultrasound available in urgent care and that she should see them first thing next week.  We discussed that if her symptoms are not improving within 24 to 48 hours or if anything worsens and she has severe pain, fever, nausea/vomiting, weakness that she needs to go to the emergency room for further evaluation.  Strict return precautions given.  Excuse note provided.  Final Clinical Impressions(s) / UC Diagnoses   Final diagnoses:  Lower abdominal pain  Pelvic pain  PID (acute pelvic inflammatory disease)     Discharge Instructions      We are treating you for pelvic inflammatory disease.  Start doxycycline 100 mg twice daily and metronidazole 500 mg twice daily for 2 weeks.  Stay out of the sun while on doxycycline as it can cause her  to have bad sunburn and avoid alcohol while taking metronidazole as this can cause you to have vomiting.  I will contact you if any of your other testing is abnormal and changes our treatment plan.  Follow up with your OB/GYN as soon as possible.  If your symptoms are not improving within 1 to 2 days or if anything worsens and you have severe pain, fever, nausea/vomiting interfering with oral intake you need to be seen immediately in the emergency room.     ED Prescriptions     Medication Sig Dispense Auth. Provider   doxycycline (VIBRAMYCIN) 100 MG capsule Take 1 capsule (100 mg total) by mouth 2 (two) times daily. 28 capsule Oland Arquette K, PA-C   metroNIDAZOLE (FLAGYL) 500 MG tablet Take 1 tablet (500 mg total) by mouth 2 (two) times daily. 28 tablet Ranbir Chew K, PA-C      PDMP not reviewed this encounter.   Sherrell Rocky POUR, PA-C 04/21/24 1942

## 2024-04-21 NOTE — ED Triage Notes (Addendum)
 Pt states seen and tx'd for UTI on Wednesday. States now having pelvic and lower back pain. States took motrin  last night with relief. States has unprotected intercourse and last time Sunday.

## 2024-04-22 LAB — URINE CULTURE: Culture: <10000 — AB

## 2024-04-23 ENCOUNTER — Ambulatory Visit (HOSPITAL_COMMUNITY): Payer: Self-pay | Admitting: Physician Assistant

## 2024-04-24 ENCOUNTER — Other Ambulatory Visit (HOSPITAL_COMMUNITY): Payer: Self-pay

## 2024-04-24 LAB — CERVICOVAGINAL ANCILLARY ONLY
Bacterial Vaginitis (gardnerella): NEGATIVE
Candida Glabrata: NEGATIVE
Candida Vaginitis: POSITIVE — AB
Chlamydia: NEGATIVE
Comment: NEGATIVE
Comment: NEGATIVE
Comment: NEGATIVE
Comment: NEGATIVE
Comment: NEGATIVE
Comment: NORMAL
Neisseria Gonorrhea: NEGATIVE
Trichomonas: NEGATIVE

## 2024-04-24 MED ORDER — FLUCONAZOLE 150 MG PO TABS
150.0000 mg | ORAL_TABLET | Freq: Once | ORAL | 0 refills | Status: DC
Start: 2024-04-24 — End: 2024-04-26
  Filled 2024-04-24: qty 2, 3d supply, fill #0

## 2024-04-26 ENCOUNTER — Other Ambulatory Visit (HOSPITAL_COMMUNITY): Payer: Self-pay

## 2024-04-26 ENCOUNTER — Other Ambulatory Visit: Payer: Self-pay

## 2024-04-26 MED ORDER — FLUCONAZOLE 150 MG PO TABS: 150.0000 mg | ORAL_TABLET | Freq: Once | ORAL | 0 refills | Status: AC

## 2024-05-16 ENCOUNTER — Other Ambulatory Visit: Payer: Self-pay

## 2024-05-23 ENCOUNTER — Emergency Department (HOSPITAL_BASED_OUTPATIENT_CLINIC_OR_DEPARTMENT_OTHER)
Admission: EM | Admit: 2024-05-23 | Discharge: 2024-05-23 | Disposition: A | Discharge status: Home / Self Care | Attending: Emergency Medicine | Admitting: Emergency Medicine

## 2024-05-23 ENCOUNTER — Other Ambulatory Visit (HOSPITAL_COMMUNITY): Payer: Self-pay

## 2024-05-23 ENCOUNTER — Emergency Department (HOSPITAL_BASED_OUTPATIENT_CLINIC_OR_DEPARTMENT_OTHER)

## 2024-05-23 ENCOUNTER — Other Ambulatory Visit: Payer: Self-pay

## 2024-05-23 DIAGNOSIS — R519 Headache, unspecified: Secondary | ICD-10-CM | POA: Insufficient documentation

## 2024-05-23 DIAGNOSIS — H538 Other visual disturbances: Secondary | ICD-10-CM | POA: Insufficient documentation

## 2024-05-23 DIAGNOSIS — R110 Nausea: Secondary | ICD-10-CM | POA: Insufficient documentation

## 2024-05-23 LAB — HCG, SERUM, QUALITATIVE: Preg, Serum: NEGATIVE

## 2024-05-23 LAB — BASIC METABOLIC PANEL WITH GFR
Anion gap: 14 (ref 5–15)
BUN: 13 mg/dL (ref 6–20)
CO2: 20 mmol/L — ABNORMAL LOW (ref 22–32)
Calcium: 9.7 mg/dL (ref 8.9–10.3)
Chloride: 102 mmol/L (ref 98–111)
Creatinine, Ser: 0.75 mg/dL (ref 0.44–1.00)
GFR, Estimated: >60 mL/min (ref >60)
Glucose, Bld: 92 mg/dL (ref 70–99)
Potassium: 4.1 mmol/L (ref 3.5–5.1)
Sodium: 137 mmol/L (ref 135–145)

## 2024-05-23 LAB — CBC
HCT: 35.8 % — ABNORMAL LOW (ref 36.0–46.0)
Hemoglobin: 11.1 g/dL — ABNORMAL LOW (ref 12.0–15.0)
MCH: 26.9 pg (ref 26.0–34.0)
MCHC: 31 g/dL (ref 30.0–36.0)
MCV: 86.7 fL (ref 80.0–100.0)
Platelets: 331 K/uL (ref 150–400)
RBC: 4.13 MIL/uL (ref 3.87–5.11)
RDW: 15.3 % (ref 11.5–15.5)
WBC: 9.3 K/uL (ref 4.0–10.5)
nRBC: 0 % (ref 0.0–0.2)

## 2024-05-23 LAB — CBG MONITORING, ED: Glucose-Capillary: 91 mg/dL (ref 70–99)

## 2024-05-23 MED ORDER — KETOROLAC TROMETHAMINE 15 MG/ML IJ SOLN
15.0000 mg | Freq: Once | INTRAMUSCULAR | Status: AC
Start: 2024-05-23 — End: 2024-05-23
  Administered 2024-05-23: 15 mg via INTRAVENOUS
  Filled 2024-05-23: qty 1

## 2024-05-23 MED ORDER — SODIUM CHLORIDE 0.9 % IV BOLUS
1000.0000 mL | Freq: Once | INTRAVENOUS | Status: AC
  Administered 2024-05-23: 1000 mL via INTRAVENOUS

## 2024-05-23 MED ORDER — DEXAMETHASONE SOD PHOSPHATE PF 10 MG/ML IJ SOLN
10.0000 mg | Freq: Once | INTRAMUSCULAR | Status: AC
  Administered 2024-05-23: 10 mg via INTRAVENOUS

## 2024-05-23 MED ORDER — SUMATRIPTAN SUCCINATE 6 MG/0.5ML ~~LOC~~ SOLN: 6.0000 mg | Freq: Once | SUBCUTANEOUS | Status: DC

## 2024-05-23 MED ORDER — PROCHLORPERAZINE EDISYLATE 10 MG/2ML IJ SOLN
10.0000 mg | Freq: Once | INTRAMUSCULAR | Status: AC
  Administered 2024-05-23: 10 mg via INTRAVENOUS
  Filled 2024-05-23: qty 2

## 2024-05-23 MED ORDER — SUMATRIPTAN SUCCINATE 50 MG PO TABS
50.0000 mg | ORAL_TABLET | ORAL | 11 refills | Status: AC | PRN
  Filled 2024-05-23: qty 10, 30d supply, fill #0

## 2024-05-23 MED ORDER — KETOROLAC TROMETHAMINE 15 MG/ML IJ SOLN: 15.0000 mg | Freq: Once | INTRAMUSCULAR | Status: DC

## 2024-05-23 MED ORDER — LEVETIRACETAM 500 MG PO TABS
1000.0000 mg | ORAL_TABLET | Freq: Once | ORAL | Status: AC
  Administered 2024-05-23: 1000 mg via ORAL
  Filled 2024-05-23: qty 2

## 2024-05-23 MED ORDER — DIPHENHYDRAMINE HCL 50 MG/ML IJ SOLN
12.5000 mg | Freq: Once | INTRAMUSCULAR | Status: AC
  Administered 2024-05-23: 12.5 mg via INTRAVENOUS
  Filled 2024-05-23: qty 1

## 2024-05-23 NOTE — Discharge Instructions (Addendum)
 As we discussed, your workup in the ER today was reassuring for acute findings.  Laboratory evaluation and CT imaging did not reveal any emergent cause of your symptoms.  Given that your symptoms have completely resolved with medications today, no further evaluation is indicated.  I do recommend that you schedule a close follow-up appointment with your PCP as well as your neurologist.  I have refilled your Imitrex  as well.  Take this as prescribed as needed.  Please ensure that you are adhering to your seizure medication regiment as well.  Return if development of any new or worsening symptoms.

## 2024-05-23 NOTE — ED Notes (Signed)
 Headache started this  morning 9AM Combine with blurry vision and nausea. On September 6th she is been in ER for bronchitis she was  given antibiotic. Bronchitis stayed with her till mid October. Since September to the mid October she was not able to take her seizers medication on a regular base., due to swallowing difficulties.

## 2024-05-23 NOTE — ED Notes (Signed)
 ED Provider at bedside.

## 2024-05-23 NOTE — ED Provider Notes (Signed)
 [place] [facility] Provider Note   CSN: [id number] Arrival date & time: 05/23/24  1026     Patient presents with: Migraine   Deborah Chavez is a 48 y.o. female.   Patient with history of diabetes, migraines, seizures presents today with complaints of headache. Reports that her symptoms began this morning around 9 am when she was on her way to work. Reports that she had an episode of confusion and brain fog and then developed a headache that was present on the left side of her head. Reports blurred vision and nausea without vomiting. Reports this is consistent with her migraines, however this is the most severe episode she has ever had. Denies fevers or chills. No neck pain or stiffness. Reports that she did miss her dose of seizure medications this morning but otherwise has been complaint. Does report she typically takes an Imitrex  for her headaches, however she is currently out of this medication. She follows with neurology for her migraines and her seizures and has a follow-up appointment in February.  The history is provided by the patient. No language interpreter was used.  Migraine Associated symptoms include headaches.       Prior to Admission medications   Medication Sig Start Date End Date Taking? Authorizing Provider  Accu-Chek FastClix Lancets MISC use to check blood sugar 2 times daily as instructed 12/24/22     ACCU-CHEK GUIDE test strip Use to check blood sugar 2 times daily as instructed 12/24/22     albuterol  (PROVENTIL  HFA;VENTOLIN  HFA) 108 (90 BASE) MCG/ACT inhaler Inhale 2 puffs into the lungs every 6 (six) hours as needed.    [provider]  Blood Glucose Monitoring Suppl (TGT BLOOD GLUCOSE MONITORING) w/Device KIT Use as directed to check blood sugar. 01/11/23     budesonide -formoterol  (SYMBICORT ) 160-4.5 MCG/ACT inhaler Inhale 2 puffs into the lungs 2 (two) times daily. 03/25/24   Harris, Abigail, PA-C  Continuous Blood Gluc  Receiver (FREESTYLE LIBRE 2 READER) DEVI Scan as needed for continuous glucose monitoring. 02/14/21     Continuous Blood Gluc Sensor (FREESTYLE LIBRE 2 SENSOR) MISC APPLY ONE SENSOR TO CLEAN NONDOMINATE ARM EVERY 14 DAYS 09/25/20   Benjamine Aland, MD  Continuous Blood Gluc Sensor (FREESTYLE LIBRE 2 SENSOR) MISC Apply 1 sensor to clean nondominate arm every 14 days 03/03/22     Continuous Glucose Sensor (FREESTYLE LIBRE 3 PLUS SENSOR) MISC Use to monitor blood sugar before meals, every 2 hours after meals, in the morning and at bedtime. 04/20/24     Continuous Glucose Sensor (FREESTYLE LIBRE 3 SENSOR) MISC Check glucose as directed (before meals, 2 hours after meals, every morning, and at bedtime) 12/17/22   Bland, Veita, MD  doxycycline (VIBRAMYCIN) 100 MG capsule Take 1 capsule (100 mg total) by mouth 2 (two) times daily. 04/21/24   Raspet, Erin K, PA-C  ergocalciferol  (VITAMIN D2) 1.25 MG (50000 UT) capsule Take 1 capsule (50,000 Units total) by mouth once a week. 05/24/23   Benjamine Aland, MD  Glucagon  (GVOKE HYPOPEN  2-PACK) 1 MG/0.2ML SOAJ USE AS DIRECTED WHEN NEEDED IF BLOOD SUGAR DROPS TO 54 OR LOWER 12/17/22   Benjamine Aland, MD  Glucagon  (GVOKE HYPOPEN  2-PACK) 1 MG/0.2ML SOAJ Use as directed when needed if blood sugar drops to 54 or lower 12/17/22   Benjamine Aland, MD  ibuprofen  (ADVIL ) 600 MG tablet Take 1 tablet (600 mg total) by mouth at bedtime for pain 07/30/22     levETIRAcetam  (KEPPRA ) 1000 MG  tablet Take 1 tablet (1,000 mg total) by mouth 2 (two) times daily. 08/24/23 08/18/24  Camara, Amadou, MD  linaclotide  (LINZESS ) 72 MCG capsule Take 1 capsule (72 mcg total) by mouth in the morning on an empty stomach with no food for 30 minutes. 10/15/23     metroNIDAZOLE (FLAGYL) 500 MG tablet Take 1 tablet (500 mg total) by mouth 2 (two) times daily. 04/21/24   Raspet, Erin K, PA-C  montelukast (SINGULAIR) 10 MG tablet Take 10 mg by mouth at bedtime.    [provider]  Semaglutide ,0.25 or 0.5MG /DOS, (OZEMPIC ,  0.25 OR 0.5 MG/DOSE,) 2 MG/3ML SOPN Inject 0.5 mg into the skin once a week. 11/12/23     SUMAtriptan  (IMITREX ) 50 MG tablet Take 1 tablet (50 mg total) by mouth as needed for migraine. May repeat in 2 hours if headache persists or recurs. 08/24/23   Camara, Amadou, MD  Norethindrone  Acetate-Ethinyl Estrad-FE (BLISOVI  24 FE) 1-20 MG-MCG(24) tablet Take 1 tablet by mouth daily. 12/11/20 02/01/21      Allergies: Aspirin, Lactose, Lactose intolerance (gi), Mobic  [meloxicam ], Tramadol hcl, Ultram [tramadol hcl], Vioxx [rofecoxib], Other, and Sulfa antibiotics    Review of Systems  Neurological:  Positive for headaches.  All other systems reviewed and are negative.   Updated Vital Signs BP 121/70 (BP Location: Right Arm)   Pulse (!) 103   Temp 98.2 F (36.8 C) (Oral)   Resp 16   SpO2 98%   Physical Exam Vitals and nursing note reviewed.  Constitutional:      General: She is not in acute distress.    Appearance: Normal appearance. She is normal weight. She is not ill-appearing, toxic-appearing or diaphoretic.  HENT:     Head: Normocephalic and atraumatic.  Eyes:     Extraocular Movements: Extraocular movements intact.     Conjunctiva/sclera: Conjunctivae normal.     Pupils: Pupils are equal, round, and reactive to light.  Neck:     Comments: No meningismus Cardiovascular:     Rate and Rhythm: Normal rate.  Pulmonary:     Effort: Pulmonary effort is normal. No respiratory distress.  Musculoskeletal:        General: Normal range of motion.     Cervical back: Normal range of motion.  Skin:    General: Skin is warm and dry.  Neurological:     General: No focal deficit present.     Mental Status: She is alert and oriented to person, place, and time.     GCS: GCS eye subscore is 4. GCS verbal subscore is 5. GCS motor subscore is 6.     Sensory: Sensation is intact.     Motor: Motor function is intact.     Coordination: Coordination is intact.     Gait: Gait is intact.     Comments:  Alert and oriented to self, place, time and event.    Speech is fluent, clear without dysarthria or dysphasia.    Strength 5/5 in upper/lower extremities   Sensation intact in upper/lower extremities    CN I not tested  CN II grossly intact visual fields bilaterally. Did not visualize posterior eye.  CN III, IV, VI PERRLA and EOMs intact bilaterally  CN V Intact sensation to sharp and light touch to the face  CN VII facial movements symmetric  CN VIII not tested  CN IX, X no uvula deviation, symmetric rise of soft palate  CN XI 5/5 SCM and trapezius strength bilaterally  CN XII Midline tongue protrusion, symmetric  L/R movements   Psychiatric:        Mood and Affect: Mood normal.        Behavior: Behavior normal.     (all labs ordered are listed, but only abnormal results are displayed) Labs Reviewed  CBC - Abnormal; Notable for the following components:      Result Value   Hemoglobin 11.1 (*)    HCT 35.8 (*)    All other components within normal limits  BASIC METABOLIC PANEL WITH GFR - Abnormal; Notable for the following components:   CO2 20 (*)    All other components within normal limits  HCG, SERUM, QUALITATIVE  CBG MONITORING, ED    EKG: None  Radiology: CT Head Wo Contrast Result Date: 05/23/2024 EXAM: CT HEAD WITHOUT CONTRAST 05/23/2024 01:39:04 PM TECHNIQUE: CT of the head was performed without the administration of intravenous contrast. Automated exposure control, iterative reconstruction, and/or weight based adjustment of the mA/kV was utilized to reduce the radiation dose to as low as reasonably achievable. COMPARISON: Head CT 05/12/2022 and MRI 10/05/2020. CLINICAL HISTORY: Headache, increasing frequency or severity. FINDINGS: BRAIN AND VENTRICLES: There is no evidence of an acute infarct, intracranial hemorrhage, mass, midline shift, hydrocephalus, or extra-axial fluid collection. Cerebral volume is normal. 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 ORBITS: No acute  abnormality. SINUSES: No acute abnormality. SOFT TISSUES AND SKULL: No acute soft tissue abnormality. No skull fracture. IMPRESSION: 1. No acute intracranial abnormality. 2. Partially empty sella, often reflecting normal anatomic variation, though can also be seen with idiopathic intracranial hypertension. Electronically signed by: Dasie Hamburg MD 05/23/2024 02:06 PM EST RP Workstation: [id number]     Procedures   Medications Ordered in the ED  prochlorperazine (COMPAZINE) injection 10 mg (has no administration in time range)  diphenhydrAMINE  (BENADRYL ) injection 12.5 mg (has no administration in time range)  dexamethasone (DECADRON) injection 10 mg (has no administration in time range)  sodium chloride  0.9 % bolus 1,000 mL (has no administration in time range)  levETIRAcetam  (KEPPRA ) tablet 1,000 mg (has no administration in time range)  ketorolac  (TORADOL ) 15 MG/ML injection 15 mg (has no administration in time range)                                    Medical Decision Making Amount and/or Complexity of Data Reviewed Labs: ordered. Radiology: ordered.  Risk Prescription drug management.   This patient is a 48 y.o. female who presents to the ED for concern of headache, this involves an extensive number of treatment options, and is a complaint that carries with it a high risk of complications and morbidity. The emergent differential diagnosis prior to evaluation includes, but is not limited to,  Stroke, increased ICP, meningitis, CVA, intracranial tumor, venous sinus thrombosis, migraine, cluster headache, hypertension, drug related, head injury, tension headache, sinusitis, dental abscess, otitis media, TMJ, temporal arteritis, glaucoma, trigeminal neuralgia.  This is not an exhaustive differential.   Past Medical History / Co-morbidities / Social History: Hx diabetes, seizures, migraines  Additional history: Chart reviewed. Pertinent results include: follows with neurology Dr.  Gregg who last saw her in February 2025  Physical Exam: Physical exam performed. The pertinent findings include: Uncomfortable appearing, alert and oriented and neurologically intact without focal deficits  Lab Tests: I ordered, and personally interpreted labs.  The pertinent results include:  hgb 11.1, bicarb 20.  No other acute laboratory abnormalities.   Imaging Studies: I  ordered imaging studies including CT head. I independently visualized and interpreted imaging which showed   1. No acute intracranial abnormality. 2. Partially empty sella is again noted, often reflecting normal anatomic variation, though can also be seen with idiopathic intracranial hypertension.   I agree with the radiologist interpretation.  Of note, reviewed patient's previous head imaging including CT and MRI which both comment on the patient's partially empty sella, appears unchanged from previous imaging   Medications: I ordered medication including Decadron Benadryl  Toradol , Keppra , Compazine, and IV fluids for headache, missing morning keppra  dose, dehydration, nausea. Reevaluation of the patient after these medicines showed that the patient resolved. I have reviewed the patients home medicines and have made adjustments as needed.  Disposition: After consideration of the diagnostic results and the patients response to treatment, I feel that emergency department workup does not suggest an emergent condition requiring admission or immediate intervention beyond what has been performed at this time. The plan is: discharge with close outpatient follow-up and return precautions.  After above interventions, patient's symptoms have completely resolved and she feels completely back to normal and ready to go home.  She no longer has any vision changes.  Given this, low suspicion for IIH, SAH, or other emergent cause of symptoms.  Patient wants to go home.  Will refill her Imitrex  per her request. Evaluation and diagnostic  testing in the emergency department does not suggest an emergent condition requiring admission or immediate intervention beyond what has been performed at this time.  Plan for discharge with close PCP follow-up.  Patient is understanding and amenable with plan, educated on red flag symptoms that would prompt immediate return.  Patient discharged in stable condition.  Final diagnoses:  Acute nonintractable headache, unspecified headache type    ED Discharge Orders          Ordered    SUMAtriptan  (IMITREX ) 50 MG tablet  Every 2 hours PRN        05/23/24 1506          An After Visit Summary was printed and given to the patient.      Tyrel Lex A, PA-C 05/23/24 1507    Long, Joshua G, MD 05/24/24 1028

## 2024-05-23 NOTE — ED Triage Notes (Signed)
 Pt reports onset of migraine headache this am. Reports hx of same. Has been out of Imitrex . Endorses n,v, photosensitivity.

## 2024-06-02 ENCOUNTER — Other Ambulatory Visit (HOSPITAL_COMMUNITY): Payer: Self-pay

## 2024-08-24 ENCOUNTER — Other Ambulatory Visit: Payer: Self-pay | Admitting: Neurology

## 2024-08-24 ENCOUNTER — Encounter: Payer: Self-pay | Admitting: Neurology

## 2024-08-24 ENCOUNTER — Ambulatory Visit: Payer: 59 | Admitting: Neurology

## 2024-08-24 VITALS — BP 128/85 | HR 85 | Ht 64.0 in | Wt 162.0 lb

## 2024-08-24 DIAGNOSIS — G43009 Migraine without aura, not intractable, without status migrainosus: Secondary | ICD-10-CM

## 2024-08-24 DIAGNOSIS — R413 Other amnesia: Secondary | ICD-10-CM

## 2024-08-24 DIAGNOSIS — R569 Unspecified convulsions: Secondary | ICD-10-CM

## 2024-08-24 MED ORDER — TOPIRAMATE 50 MG PO TABS: 50.0000 mg | ORAL_TABLET | Freq: Every evening | ORAL | 0 refills | Status: AC
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
## 2024-08-24 NOTE — Patient Instructions (Addendum)
 Continue levetiracetam  1000 mg twice daily Will add topiramate , 50 mg, start with half tablet nightly for 2 weeks then increase to full tablet nightly for migraine prevention.  Will continue to increase as tolerated to control the headaches Please call for side effect of the medication Follow-up in 6 months or sooner if worse Please keep a headache diary

## 2025-03-12 ENCOUNTER — Ambulatory Visit: Admitting: Neurology
